# Patient Record
Sex: Female | Born: 1995 | Hispanic: No | Marital: Single | Smoking: Never smoker
Health system: Southern US, Community
[De-identification: ages and names within clinical notes are randomized; demographics above are authoritative.]

## PROBLEM LIST (undated history)

## (undated) ENCOUNTER — Inpatient Hospital Stay (HOSPITAL_COMMUNITY): Payer: Self-pay

## (undated) DIAGNOSIS — C9590 Leukemia, unspecified not having achieved remission: Secondary | ICD-10-CM

## (undated) DIAGNOSIS — F419 Anxiety disorder, unspecified: Secondary | ICD-10-CM

## (undated) DIAGNOSIS — J45909 Unspecified asthma, uncomplicated: Secondary | ICD-10-CM

## (undated) DIAGNOSIS — F32A Depression, unspecified: Secondary | ICD-10-CM

## (undated) HISTORY — PX: WISDOM TOOTH EXTRACTION: SHX21

## (undated) HISTORY — PX: PORTACATH PLACEMENT: SHX2246

---
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
## 2017-02-02 HISTORY — PX: RHINOPLASTY: SUR1284

## 2019-01-05 ENCOUNTER — Ambulatory Visit (HOSPITAL_COMMUNITY)
Admission: EM | Admit: 2019-01-05 | Discharge: 2019-01-05 | Disposition: A | Discharge status: Home / Self Care | Payer: BC Managed Care – PPO | Attending: Family Medicine | Admitting: Family Medicine

## 2019-01-05 ENCOUNTER — Other Ambulatory Visit: Payer: Self-pay

## 2019-01-05 ENCOUNTER — Encounter (HOSPITAL_COMMUNITY): Payer: Self-pay

## 2019-01-05 DIAGNOSIS — Z20828 Contact with and (suspected) exposure to other viral communicable diseases: Secondary | ICD-10-CM | POA: Diagnosis not present

## 2019-01-05 DIAGNOSIS — J029 Acute pharyngitis, unspecified: Secondary | ICD-10-CM | POA: Insufficient documentation

## 2019-01-05 DIAGNOSIS — J069 Acute upper respiratory infection, unspecified: Secondary | ICD-10-CM | POA: Insufficient documentation

## 2019-01-05 DIAGNOSIS — Z8249 Family history of ischemic heart disease and other diseases of the circulatory system: Secondary | ICD-10-CM | POA: Diagnosis not present

## 2019-01-05 LAB — POCT RAPID STREP A: Streptococcus, Group A Screen (Direct): NEGATIVE

## 2019-01-05 LAB — POC SARS CORONAVIRUS 2 AG: SARS Coronavirus 2 Ag: NEGATIVE

## 2019-01-05 LAB — POC SARS CORONAVIRUS 2 AG -  ED: SARS Coronavirus 2 Ag: NEGATIVE

## 2019-01-05 NOTE — ED Triage Notes (Signed)
Pt. States Sat. Morning she woke up with a nasal congestion, sore throat, dry cough, body aches/chills, headaches, she left work yesterday early. Wants COVID testing.

## 2019-01-05 NOTE — Discharge Instructions (Signed)
If your Covid-19 test is positive, you will receive a phone call from [facility] regarding your results. Negative test results are not called. Both positive and negative results area always visible on MyChart. If you do not have a MyChart account, sign up instructions are in your discharge papers.  You may use over the counter ibuprofen or acetaminophen as needed for your sore throat.  Over the counter products such as Colgate Peroxyl Mouth Sore Rinse or Chloraseptic Sore Throat Spray may provide some temporary relief. Your rapid strep test was negative today. We have sent your throat swab for culture and will let you know of any positive results.

## 2019-01-06 LAB — NOVEL CORONAVIRUS, NAA (HOSP ORDER, SEND-OUT TO REF LAB; TAT 18-24 HRS): SARS-CoV-2, NAA: NOT DETECTED

## 2019-01-07 NOTE — ED Provider Notes (Signed)
[facility]   HR:[id number] 01/05/19 Arrival Time: [id number]  ASSESSMENT & PLAN:  1. Sore throat   2. Viral URI     POC COVID negative. Reflex COVID testing sent. To self-quarantine until results are available. Rapid strep negative. Throat culture sent. Discussed viral syndrome in general. OTC symptom care.    Discharge Instructions     If your Covid-19 test is positive, you will receive a phone call from [facility] regarding your results. Negative test results are not called. Both positive and negative results area always visible on MyChart. If you do not have a MyChart account, sign up instructions are in your discharge papers.   You may use over the counter ibuprofen or acetaminophen as needed for your sore throat.   Over the counter products such as Colgate Peroxyl Mouth Sore Rinse or Chloraseptic Sore Throat Spray may provide some temporary relief.  Your rapid strep test was negative today. We have sent your throat swab for culture and will let you know of any positive results.          Follow-up Information    [facility].   Specialty: Urgent Care Why: As needed. Contact information: [place] [phone]          Reviewed expectations re: course of current medical issues. Questions answered. Outlined signs and symptoms indicating need for more acute intervention. Patient verbalized understanding. After Visit Summary given.   SUBJECTIVE: History from: patient. Jaime Payne is a 23 y.o. female who requests COVID-19 testing. Known COVID-19 contact: questions co-worker. Recent travel: none. Denies: fever, difficulty breathing and headache. Does report mild ST, nasal congestion. "Felt chilled last night."  ROS: As per HPI.   OBJECTIVE:  Vitals:   01/05/19 1213  BP: (!) 119/57  Pulse: 78  Resp: 17  Temp: 99.6 F (37.6 C)  TempSrc: Oral  SpO2: 99%    General  appearance: alert; no distress Eyes: PERRLA; EOMI; conjunctiva normal HENT: [place]; AT; nasal mucosa normal; oral mucosa normal Neck: supple  ungs: clear to auscultation bilaterally; unlabored Heart: regular rate and rhythm Abdomen: soft, non-tender Extremities: no edema Skin: warm and dry Neurologic: normal gait Psychological: alert and cooperative; normal mood and affect  Labs:  Labs Reviewed  CULTURE, GROUP A STREP ([facility])  NOVEL CORONAVIRUS, NAA (HOSP ORDER, SEND-OUT TO REF LAB; TAT 18-24 HRS)  POC SARS CORONAVIRUS 2 AG -  ED  POC SARS CORONAVIRUS 2 AG  POCT RAPID STREP A     Social History   Socioeconomic History  . Marital status: Single    Spouse name: Not on file  . Number of children: Not on file  . Years of education: Not on file  . Highest education level: Not on file  Occupational History  . Not on file  Social Needs  . Financial resource strain: Not on file  . Food insecurity    Worry: Not on file    Inability: Not on file  . Transportation needs    Medical: Not on file    Non-medical: Not on file  Tobacco Use  . Smoking status: Never Smoker  . Smokeless tobacco: Never Used  Substance and Sexual Activity  . Alcohol use: Never    Frequency: Never  . Drug use: Never  . Sexual activity: Not on file  Lifestyle  . Physical activity    Days per week: Not on file    Minutes per session: Not  on file  . Stress: Not on file  Relationships  . Social Herbalist on phone: Not on file    Gets together: Not on file    Attends religious service: Not on file    Active member of club or organization: Not on file    Attends meetings of clubs or organizations: Not on file    Relationship status: Not on file  . Intimate partner violence    Fear of current or ex partner: Not on file    Emotionally abused: Not on file    Physically abused: Not on file    Forced sexual activity: Not on file  Other Topics Concern  . Not on file  Social History Narrative   . Not on file   Family History  Problem Relation Age of Onset  . Anxiety disorder Mother   . Diabetes Mother   . Hypertension Mother   . Diabetes Father   . Hypertension Father    History reviewed. No pertinent surgical history.   Vanessa Kick, MD 01/07/19 1004

## 2019-01-08 LAB — CULTURE, GROUP A STREP (THRC)

## 2019-01-19 ENCOUNTER — Encounter: Payer: Self-pay | Admitting: Obstetrics

## 2019-01-19 ENCOUNTER — Other Ambulatory Visit: Payer: Self-pay

## 2019-01-19 ENCOUNTER — Ambulatory Visit (INDEPENDENT_AMBULATORY_CARE_PROVIDER_SITE_OTHER): Payer: BC Managed Care – PPO

## 2019-01-19 VITALS — BP 110/67 | HR 89 | Temp 98.3°F | Wt 160.0 lb

## 2019-01-19 DIAGNOSIS — Z3201 Encounter for pregnancy test, result positive: Secondary | ICD-10-CM | POA: Diagnosis not present

## 2019-01-19 LAB — POCT URINE PREGNANCY: Preg Test, Ur: POSITIVE — AB

## 2019-01-19 NOTE — Progress Notes (Addendum)
Ms. Mcdowall presents today for UPT. She has no unusual complaints.  LMP:12/01/2018    OBJECTIVE: Appears well, in no apparent distress.  OB History   No obstetric history on file.    Home UPT Result:POSITIVE X2 In-Office UPT result: POSITIVE  Patient seen and assessed by nursing staff during this encounter. I have reviewed the chart and agree with the documentation and plan.  Baltazar Najjar, MD 02/06/2019 8:40 AM    I have reviewed the patient's medical, obstetrical, social, and family histories, and medications.   ASSESSMENT: Positive pregnancy test  PLAN Prenatal care to be completed at: Patient is unsure at this time, she is weighting her options.  Patient seen and assessed by nursing staff during this encounter. I have reviewed the chart and agree with the documentation and plan.  Baltazar Najjar, MD 01/19/2019

## 2019-11-29 LAB — OB RESULTS CONSOLE RPR: RPR: NONREACTIVE

## 2019-11-29 LAB — OB RESULTS CONSOLE RUBELLA ANTIBODY, IGM: Rubella: NON-IMMUNE/NOT IMMUNE

## 2019-11-29 LAB — OB RESULTS CONSOLE GC/CHLAMYDIA
Chlamydia: POSITIVE
Gonorrhea: NEGATIVE

## 2019-11-29 LAB — OB RESULTS CONSOLE HEPATITIS B SURFACE ANTIGEN: Hepatitis B Surface Ag: NEGATIVE

## 2019-11-29 LAB — OB RESULTS CONSOLE GBS: GBS: POSITIVE

## 2019-11-29 LAB — HEPATITIS C ANTIBODY: HCV Ab: NEGATIVE

## 2019-11-29 LAB — OB RESULTS CONSOLE HIV ANTIBODY (ROUTINE TESTING): HIV: NONREACTIVE

## 2020-02-03 NOTE — L&D Delivery Note (Signed)
Delivery Note Jaime Payne is a G2P0010 at 20w0dwho had a spontaneous delivery at 2101 a viable female was delivered via  LOA.  APGAR: 8,9 ; weight 9lb1oz (4111g)  .    Admitted for painful contractions and elevated blood pressures. Met criteria for gestational hypertension throughout intrapartum course. Labor augmented with pitocin. Progressed normally. Received epidural for pain management. Pushed for 33 minutes. Baby was delivered without difficulty. No nuchal cord. Delivery of placenta was spontaneous.   Cervix examined and intact. 2nd degree perineal laceration was repaired in the normal sterile fashion with 2-0 and 3-0 vicryl. A bleeding right vaginal laceration was repaired with 3-0 vicryl. DRE with good rectal tone and no sutures.   Bimanual exam significant for lower uterine segment clots, which were expressed. Misoprostol 8061m administered per rectum due to lower uterine segment atony with good response.  Estimated blood loss 200cc. Instrument and gauze counts were correct at the end of the procedure.   Placenta status: to L&D Anesthesia:  epidural Episiotomy:  none Lacerations:  2nd degree perineal and superificial right vaginal Suture Repair: 2.0 3.0 vicryl Est. Blood Loss (mL):  2001mMom to postpartum.  Baby to Couplet care / Skin to Skin.  Jaime Lathe27/2022, 9:37 PM

## 2020-04-09 DIAGNOSIS — O99810 Abnormal glucose complicating pregnancy: Secondary | ICD-10-CM | POA: Diagnosis not present

## 2020-04-17 ENCOUNTER — Other Ambulatory Visit: Payer: Self-pay | Admitting: Internal Medicine

## 2020-04-17 DIAGNOSIS — R0600 Dyspnea, unspecified: Secondary | ICD-10-CM | POA: Diagnosis not present

## 2020-04-18 ENCOUNTER — Other Ambulatory Visit (HOSPITAL_COMMUNITY): Payer: Self-pay | Admitting: Family Medicine

## 2020-04-18 ENCOUNTER — Other Ambulatory Visit: Payer: Self-pay | Admitting: Family Medicine

## 2020-04-18 DIAGNOSIS — R0600 Dyspnea, unspecified: Secondary | ICD-10-CM

## 2020-04-18 DIAGNOSIS — R0602 Shortness of breath: Secondary | ICD-10-CM

## 2020-04-18 DIAGNOSIS — R059 Cough, unspecified: Secondary | ICD-10-CM

## 2020-04-25 ENCOUNTER — Emergency Department (HOSPITAL_COMMUNITY)
Admission: EM | Admit: 2020-04-25 | Discharge: 2020-04-25 | Disposition: A | Discharge status: Home / Self Care | Payer: Medicaid Other | Attending: Emergency Medicine | Admitting: Emergency Medicine

## 2020-04-25 ENCOUNTER — Emergency Department (HOSPITAL_COMMUNITY): Payer: Medicaid Other

## 2020-04-25 ENCOUNTER — Ambulatory Visit (HOSPITAL_COMMUNITY): Payer: Medicaid Other

## 2020-04-25 ENCOUNTER — Encounter (HOSPITAL_COMMUNITY): Payer: Self-pay

## 2020-04-25 ENCOUNTER — Encounter (HOSPITAL_COMMUNITY): Payer: Self-pay | Admitting: Emergency Medicine

## 2020-04-25 DIAGNOSIS — D72829 Elevated white blood cell count, unspecified: Secondary | ICD-10-CM | POA: Diagnosis not present

## 2020-04-25 DIAGNOSIS — O99511 Diseases of the respiratory system complicating pregnancy, first trimester: Secondary | ICD-10-CM | POA: Diagnosis present

## 2020-04-25 DIAGNOSIS — B9689 Other specified bacterial agents as the cause of diseases classified elsewhere: Secondary | ICD-10-CM | POA: Diagnosis not present

## 2020-04-25 DIAGNOSIS — R000 Tachycardia, unspecified: Secondary | ICD-10-CM | POA: Diagnosis not present

## 2020-04-25 DIAGNOSIS — Z8616 Personal history of COVID-19: Secondary | ICD-10-CM | POA: Diagnosis not present

## 2020-04-25 DIAGNOSIS — R0602 Shortness of breath: Secondary | ICD-10-CM | POA: Diagnosis not present

## 2020-04-25 DIAGNOSIS — Z3A01 Less than 8 weeks gestation of pregnancy: Secondary | ICD-10-CM | POA: Diagnosis not present

## 2020-04-25 DIAGNOSIS — K449 Diaphragmatic hernia without obstruction or gangrene: Secondary | ICD-10-CM | POA: Diagnosis not present

## 2020-04-25 DIAGNOSIS — R059 Cough, unspecified: Secondary | ICD-10-CM | POA: Diagnosis not present

## 2020-04-25 DIAGNOSIS — R0600 Dyspnea, unspecified: Secondary | ICD-10-CM

## 2020-04-25 LAB — URINALYSIS, ROUTINE W REFLEX MICROSCOPIC
Bilirubin Urine: NEGATIVE
Glucose, UA: NEGATIVE mg/dL
Hgb urine dipstick: NEGATIVE
Ketones, ur: NEGATIVE mg/dL
Nitrite: NEGATIVE
Protein, ur: NEGATIVE mg/dL
Specific Gravity, Urine: 1.009 (ref 1.005–1.030)
Squamous Epithelial / LPF: >50 — ABNORMAL HIGH (ref 0–5)
pH: 6 (ref 5.0–8.0)

## 2020-04-25 LAB — CBC WITH DIFFERENTIAL/PLATELET
Abs Immature Granulocytes: 0.12 10*3/uL — ABNORMAL HIGH (ref 0.00–0.07)
Basophils Absolute: 0.1 10*3/uL (ref 0.0–0.1)
Basophils Relative: 1 %
Eosinophils Absolute: 0.1 10*3/uL (ref 0.0–0.5)
Eosinophils Relative: 1 %
HCT: 28.4 % — ABNORMAL LOW (ref 36.0–46.0)
Hemoglobin: 9.4 g/dL — ABNORMAL LOW (ref 12.0–15.0)
Immature Granulocytes: 1 %
Lymphocytes Relative: 48 %
Lymphs Abs: 4 10*3/uL (ref 0.7–4.0)
MCH: 30.8 pg (ref 26.0–34.0)
MCHC: 33.1 g/dL (ref 30.0–36.0)
MCV: 93.1 fL (ref 80.0–100.0)
Monocytes Absolute: 0.4 10*3/uL (ref 0.1–1.0)
Monocytes Relative: 5 %
Neutro Abs: 3.7 10*3/uL (ref 1.7–7.7)
Neutrophils Relative %: 44 %
Platelets: 230 10*3/uL (ref 150–400)
RBC: 3.05 MIL/uL — ABNORMAL LOW (ref 3.87–5.11)
RDW: 15.4 % (ref 11.5–15.5)
WBC: 8.5 10*3/uL (ref 4.0–10.5)
nRBC: 0 % (ref 0.0–0.2)

## 2020-04-25 LAB — COMPREHENSIVE METABOLIC PANEL
ALT: 33 U/L (ref 0–44)
AST: 32 U/L (ref 15–41)
Albumin: 2.3 g/dL — ABNORMAL LOW (ref 3.5–5.0)
Alkaline Phosphatase: 139 U/L — ABNORMAL HIGH (ref 38–126)
Anion gap: 7 (ref 5–15)
BUN: <5 mg/dL — ABNORMAL LOW (ref 6–20)
CO2: 21 mmol/L — ABNORMAL LOW (ref 22–32)
Calcium: 8.7 mg/dL — ABNORMAL LOW (ref 8.9–10.3)
Chloride: 108 mmol/L (ref 98–111)
Creatinine, Ser: 0.45 mg/dL (ref 0.44–1.00)
GFR, Estimated: >60 mL/min (ref >60)
Glucose, Bld: 94 mg/dL (ref 70–99)
Potassium: 4 mmol/L (ref 3.5–5.1)
Sodium: 136 mmol/L (ref 135–145)
Total Bilirubin: 0.5 mg/dL (ref 0.3–1.2)
Total Protein: 5.9 g/dL — ABNORMAL LOW (ref 6.5–8.1)

## 2020-04-25 MED ORDER — IOHEXOL 350 MG/ML SOLN
100.0000 mL | Freq: Once | INTRAVENOUS | Status: AC | PRN
  Administered 2020-04-25: 52 mL via INTRAVENOUS

## 2020-04-25 NOTE — ED Triage Notes (Signed)
Pt sent over by MD to r/o PE , pt has been sob for a Few weeks now , got an inhaler that helped some , pt is 7.5 months pregnant

## 2020-04-25 NOTE — ED Notes (Signed)
Patient transported to CT 

## 2020-04-25 NOTE — ED Provider Notes (Signed)
[facility] EMERGENCY DEPARTMENT Provider Note   CSN: [id number] Arrival date & time: 04/25/20  1053     History No chief complaint on file.   Jaime Payne is a 25 y.o. female.  HPI Patient is a 25 year old female who presents the emergency department due to worsening shortness of breath as well as dry cough.  Patient states she is G1 and about 7.5 months pregnant.  She states that she got COVID-19 in January of this year and since being diagnosed with COVID-19 she has had a persistent cough.  She states it occurs intermittently throughout the day and is dry.  She will experience chest tightness when coughing but otherwise denies any continued chest pain.  Also reports some mild shortness of breath that began worsening over the past few weeks.  Also reports intermittent swelling in the lower extremities that worsens when standing for long periods of time and alleviates when elevating her legs.  She was evaluated by her primary doctor and they requested that she come to the emergency department for rule out of PE.  No abdominal pain, nausea, vomiting, urinary complaints.    History reviewed. No pertinent past medical history.  There are no problems to display for this patient.   History reviewed. No pertinent surgical history.   OB History    Gravida  1   Para      Term      Preterm      AB      Living        SAB      IAB      Ectopic      Multiple      Live Births              Family History  Problem Relation Age of Onset  . Anxiety disorder Mother   . Diabetes Mother   . Hypertension Mother   . Diabetes Father   . Hypertension Father     Social History   Tobacco Use  . Smoking status: Never Smoker  . Smokeless tobacco: Never Used  Substance Use Topics  . Alcohol use: Never  . Drug use: Never    Home Medications Prior to Admission medications   Not on File    Allergies    Patient has no known allergies.  Review of  Systems   Review of Systems  All other systems reviewed and are negative. Ten systems reviewed and are negative for acute change, except as noted in the HPI.    Physical Exam Updated Vital Signs BP 110/60   Pulse 97   Temp 98.6 F (37 C)   Resp (!) 26   SpO2 96%   Physical Exam Vitals and nursing note reviewed.  Constitutional:      General: She is not in acute distress.    Appearance: Normal appearance. She is not ill-appearing, toxic-appearing or diaphoretic.  HENT:     Head: Normocephalic and atraumatic.     Right Ear: External ear normal.     Left Ear: External ear normal.     Nose: Nose normal.     Mouth/Throat:     Mouth: Mucous membranes are moist.     Pharynx: Oropharynx is clear. No oropharyngeal exudate or posterior oropharyngeal erythema.  Eyes:     Extraocular Movements: Extraocular movements intact.  Cardiovascular:     Rate and Rhythm: Regular rhythm. Tachycardia present.     Pulses: Normal pulses.     Heart  sounds: Normal heart sounds. No murmur heard. No friction rub. No gallop.   Pulmonary:     Effort: Pulmonary effort is normal. No respiratory distress.     Breath sounds: Normal breath sounds. No stridor. No wheezing, rhonchi or rales.     Comments: Lungs are clear to auscultation bilaterally. Abdominal:     General: Abdomen is flat.     Tenderness: There is no abdominal tenderness.  Musculoskeletal:        General: Normal range of motion.     Cervical back: Normal range of motion and neck supple. No tenderness.     Right lower leg: Edema present.     Left lower leg: Edema present.     Comments: 1+ symmetrical pitting edema noted in the bilateral lower extremities.  No calf pain.  Skin:    General: Skin is warm and dry.  Neurological:     General: No focal deficit present.     Mental Status: She is alert and oriented to person, place, and time.  Psychiatric:        Mood and Affect: Mood normal.        Behavior: Behavior normal.    ED Results  / Procedures / Treatments   Labs (all labs ordered are listed, but only abnormal results are displayed) Labs Reviewed  COMPREHENSIVE METABOLIC PANEL - Abnormal; Notable for the following components:      Result Value   CO2 21 (*)    BUN <5 (*)    Calcium 8.7 (*)    Total Protein 5.9 (*)    Albumin 2.3 (*)    Alkaline Phosphatase 139 (*)    All other components within normal limits  CBC WITH DIFFERENTIAL/PLATELET - Abnormal; Notable for the following components:   RBC 3.05 (*)    Hemoglobin 9.4 (*)    HCT 28.4 (*)    Abs Immature Granulocytes 0.12 (*)    All other components within normal limits  URINALYSIS, ROUTINE W REFLEX MICROSCOPIC - Abnormal; Notable for the following components:   APPearance CLOUDY (*)    Leukocytes,Ua LARGE (*)    Bacteria, UA FEW (*)    Squamous Epithelial / LPF >50 (*)    All other components within normal limits    EKG EKG Interpretation  Date/Time:  Thursday April 25 2020 11:14:30 EDT Ventricular Rate:  93 PR Interval:    QRS Duration: 95 QT Interval:  342 QTC Calculation: 426 R Axis:   79 Text Interpretation: Sinus rhythm ST elev, probable normal early repol pattern Confirmed by Lennice Sites [phone]) on 04/25/2020 11:25:56 AM   Radiology CT Angio Chest PE W and/or Wo Contrast  Result Date: 04/25/2020 CLINICAL DATA:  Cough and shortness of breath EXAM: CT ANGIOGRAPHY CHEST WITH CONTRAST TECHNIQUE: Multidetector CT imaging of the chest was performed using the standard protocol during bolus administration of intravenous contrast. Multiplanar CT image reconstructions and MIPs were obtained to evaluate the vascular anatomy. CONTRAST:  52 mL OMNIPAQUE IOHEXOL 350 MG/ML SOLN COMPARISON:  None. FINDINGS: Cardiovascular: There is no demonstrable pulmonary embolus. There is no thoracic aortic aneurysm or dissection. The visualized great vessels normal. Note that the right innominate and left common carotid arteries arise as a common trunk, an anatomic  variant. No pericardial effusion or pericardial thickening. Mediastinum/Nodes: Visualized thyroid appears normal. Thymic tissue is present, normal for age. There is no appreciable thoracic adenopathy. There is a small hiatal hernia. Lungs/Pleura: Clear. No appreciable pleural effusion. No pneumothorax. Trachea and major bronchial structures  appear patent. Upper Abdomen: Visualized upper abdominal structures appear unremarkable. Musculoskeletal: There are no blastic or lytic bone lesions. No evident chest wall lesions. Review of the MIP images confirms the above findings. IMPRESSION: 1. No demonstrable pulmonary embolus. No thoracic aortic aneurysm or dissection. 2.  Lungs clear. 3.  No evident adenopathy. Electronically Signed   By: Lowella Grip III M.D.   On: 04/25/2020 13:48   Procedures Procedures   Medications Ordered in ED Medications  iohexol (OMNIPAQUE) 350 MG/ML injection 100 mL (52 mLs Intravenous Contrast Given 04/25/20 1323)    ED Course  I have reviewed the triage vital signs and the nursing notes.  Pertinent labs & imaging results that were available during my care of the patient were reviewed by me and considered in my medical decision making (see chart for details).    MDM Rules/Calculators/A&P                          Pt is a 25 y.o. female who presents the emergency department due to cough and shortness of breath.  She was sent to the ED by her regular doctor for pulmonary embolism rule out.  Labs: CBC with an RBC of 3.05, hemoglobin of 9.4, hematocrit of 28.4. CMP with a CO2 of 21, BUN less than 5, calcium of 8.7, total protein of 5.9, albumin of 2.3, alk phos of 139. UA showing greater than 50 squamous epithelial cells.  Large leukocytes as well as few bacteria.  Imaging: CT PE study showing no PE, lungs are clear, no thoracic aortic aneurysm or dissection, no evident adenopathy.  I, Rayna Sexton, PA-C, personally reviewed and evaluated these images and lab  results as part of my medical decision-making.  Lab work and imaging is reassuring.  No signs of PE.  Oxygen saturations above 98% since arrival.  This was discussed with the patient and she was quite relieved.  Recommended that she follow-up with her regular doctor for further evaluation regarding her cough.  We discussed return precautions.  Feel she is stable for discharge and she is agreeable.  Her questions were answered and she was amicable to time of discharge.  Note: Portions of this report may have been transcribed using voice recognition software. Every effort was made to ensure accuracy; however, inadvertent computerized transcription errors may be present.   Final Clinical Impression(s) / ED Diagnoses Final diagnoses:  Cough  Dyspnea, unspecified type   Rx / DC Orders ED Discharge Orders    None       Rayna Sexton, PA-C 04/25/20 1501    Lennice Sites, DO 04/25/20 1503

## 2020-04-25 NOTE — Discharge Instructions (Signed)
Like we discussed, if you develop any new or worsening symptoms please return to the emergency department immediately for reevaluation.  Otherwise, please follow-up with your regular doctor regarding your symptoms.  It was a pleasure to meet you.

## 2020-04-26 DIAGNOSIS — Z23 Encounter for immunization: Secondary | ICD-10-CM | POA: Diagnosis not present

## 2020-05-07 DIAGNOSIS — Z3A32 32 weeks gestation of pregnancy: Secondary | ICD-10-CM | POA: Diagnosis not present

## 2020-05-07 DIAGNOSIS — O99213 Obesity complicating pregnancy, third trimester: Secondary | ICD-10-CM | POA: Diagnosis not present

## 2020-05-20 ENCOUNTER — Other Ambulatory Visit: Payer: Self-pay

## 2020-05-20 ENCOUNTER — Encounter (HOSPITAL_COMMUNITY): Payer: Self-pay | Admitting: Obstetrics & Gynecology

## 2020-05-20 ENCOUNTER — Inpatient Hospital Stay (HOSPITAL_COMMUNITY)
Admission: AD | Admit: 2020-05-20 | Discharge: 2020-05-20 | Disposition: A | Discharge status: Home / Self Care | Payer: Medicaid Other | Attending: Obstetrics & Gynecology | Admitting: Obstetrics & Gynecology

## 2020-05-20 DIAGNOSIS — Z3A34 34 weeks gestation of pregnancy: Secondary | ICD-10-CM | POA: Diagnosis not present

## 2020-05-20 DIAGNOSIS — O26893 Other specified pregnancy related conditions, third trimester: Secondary | ICD-10-CM | POA: Diagnosis not present

## 2020-05-20 DIAGNOSIS — R102 Pelvic and perineal pain: Secondary | ICD-10-CM

## 2020-05-20 DIAGNOSIS — Z7951 Long term (current) use of inhaled steroids: Secondary | ICD-10-CM | POA: Diagnosis not present

## 2020-05-20 DIAGNOSIS — O2343 Unspecified infection of urinary tract in pregnancy, third trimester: Secondary | ICD-10-CM

## 2020-05-20 HISTORY — DX: Anxiety disorder, unspecified: F41.9

## 2020-05-20 HISTORY — DX: Unspecified asthma, uncomplicated: J45.909

## 2020-05-20 HISTORY — DX: Depression, unspecified: F32.A

## 2020-05-20 HISTORY — DX: Leukemia, unspecified not having achieved remission: C95.90

## 2020-05-20 LAB — URINALYSIS, ROUTINE W REFLEX MICROSCOPIC
Bilirubin Urine: NEGATIVE
Glucose, UA: NEGATIVE mg/dL
Hgb urine dipstick: NEGATIVE
Ketones, ur: 5 mg/dL — AB
Nitrite: NEGATIVE
Protein, ur: 30 mg/dL — AB
Specific Gravity, Urine: 1.021 (ref 1.005–1.030)
pH: 5 (ref 5.0–8.0)

## 2020-05-20 LAB — WET PREP, GENITAL
Clue Cells Wet Prep HPF POC: NONE SEEN
Sperm: NONE SEEN
Trich, Wet Prep: NONE SEEN
Yeast Wet Prep HPF POC: NONE SEEN

## 2020-05-20 MED ORDER — CEFADROXIL 500 MG PO CAPS: 500.0000 mg | ORAL_CAPSULE | Freq: Two times a day (BID) | ORAL | 0 refills | Status: DC

## 2020-05-20 MED ORDER — CYCLOBENZAPRINE HCL 5 MG PO TABS
10.0000 mg | ORAL_TABLET | Freq: Once | ORAL | Status: AC
  Administered 2020-05-20: 10 mg via ORAL
  Filled 2020-05-20: qty 2

## 2020-05-20 MED ORDER — ACETAMINOPHEN 325 MG PO TABS: 650.0000 mg | ORAL_TABLET | ORAL | 0 refills | Status: AC | PRN

## 2020-05-20 MED ORDER — CYCLOBENZAPRINE HCL 10 MG PO TABS: 10.0000 mg | ORAL_TABLET | Freq: Two times a day (BID) | ORAL | 0 refills | Status: DC | PRN

## 2020-05-20 MED ORDER — ACETAMINOPHEN 500 MG PO TABS
1000.0000 mg | ORAL_TABLET | Freq: Once | ORAL | Status: AC
  Administered 2020-05-20: 1000 mg via ORAL
  Filled 2020-05-20: qty 2

## 2020-05-20 NOTE — MAU Note (Signed)
Having unbearable pelvic pain.  Started on Friday morning, but went away.  Again on Sat and Sun.  Today is much worse and not going away, can't even walk right.   No bleeding or ROM.

## 2020-05-20 NOTE — MAU Provider Note (Signed)
History     CSN: [id number]  Arrival date and time: 05/20/20 1448   Event Date/Time   First Provider Initiated Contact with Patient 05/20/20 1533      Chief Complaint  Patient presents with  . Pelvic Pain   HPI Jaime Payne is a 25 y.o. G2P0010 at [redacted]w[redacted]d who presents to MAU with chief complaint of pelvic pain. This is a new problem, onset Friday morning. Patient endorses working her normal shift Thursday. She states she woke up with 10/10 pain in the middle of her vagina. She has experienced the same pain with movement since that time. She states she was sedentary all weekend. Pain completely resolves at rest but returns as soon as she ambulates. She has not taken medication as she prefers to avoid all medications unless absolutely necessary.  Patient works as a Scientist, physiological. She was given a chair to use during her shifts but continues to perform a significant amount of short-distance walking, bending, and twisting. She states she only walks at work and typically sits or lies down when not working.  Patient receives prenatal care with [facility]. Her next appointment was scheduled for 04/29 but she had to cancel it to accommodate her baby shower and states she was not given an option for rescheduling.  OB History    Gravida  2   Para      Term      Preterm      AB  1   Living        SAB      IAB  1   Ectopic      Multiple      Live Births              Past Medical History:  Diagnosis Date  . Anxiety   . Asthma   . Depression   . 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 PORTACATH PLACEMENT    . RHINOPLASTY  2019    Family History  Problem Relation Age of Onset  . Anxiety disorder Mother   . Diabetes Mother   . Hypertension Mother   . Diabetes Father   . Hypertension Father     Social History   Tobacco Use  . Smoking status: Never Smoker  . Smokeless tobacco: Never Used  Vaping Use  . Vaping Use:  Never used  Substance Use Topics  . Alcohol use: Never  . Drug use: Never    Allergies: No Known Allergies  Medications Prior to Admission  Medication Sig Dispense Refill Last Dose  . budesonide (PULMICORT) 180 MCG/ACT inhaler Inhale into the lungs 2 (two) times daily.   05/19/2020 at Unknown time  . ferrous sulfate 325 (65 FE) MG tablet Take 325 mg by mouth daily with breakfast.   05/20/2020 at Unknown time  . Prenatal Vit-Fe Fumarate-FA (PRENATAL MULTIVITAMIN) TABS tablet Take 1 tablet by mouth daily at 12 noon.   05/20/2020 at Unknown time    Review of Systems  Genitourinary: Positive for pelvic pain.  All other systems reviewed and are negative.  Physical Exam   Blood pressure 117/73, pulse (!) 107, temperature 97.9 F (36.6 C), temperature source Oral, resp. rate 20, height 5\' 5"  (1.651 m), weight 93.1 kg, SpO2 99 %.  Physical Exam Vitals and nursing note reviewed. Exam conducted with a chaperone present.  Constitutional:      Appearance: Normal appearance. She is not ill-appearing.  Cardiovascular:  Rate and Rhythm: Normal rate.     Pulses: Normal pulses.     Heart sounds: Normal heart sounds.  Pulmonary:     Effort: Pulmonary effort is normal.     Breath sounds: Normal breath sounds.  Abdominal:     Comments: Gravid  Skin:    Capillary Refill: Capillary refill takes less than 2 seconds.  Neurological:     Mental Status: She is alert and oriented to person, place, and time.  Psychiatric:        Mood and Affect: Mood normal.        Behavior: Behavior normal.        Thought Content: Thought content normal.        Judgment: Judgment normal.     MAU Course  Procedures  --Cervix visually closed on speculum exam.  --Reactive tracing: baseline 130, mod var, + accels, no decels --Toco: UI --Discussed suspicion for syphysis pubis as origin of pain. Reviewed interventions including PO medication.   Orders Placed This Encounter  Procedures  . Wet prep, genital   . Culture, OB Urine  . Urinalysis, Routine w reflex microscopic Urine, Clean Catch  . Discharge patient   Patient Vitals for the past 24 hrs:  BP Temp Temp src Pulse Resp SpO2 Height Weight  05/20/20 1643 128/72 -- -- 94 14 -- -- --  05/20/20 1640 -- -- -- -- -- 99 % -- --  05/20/20 1524 117/73 -- -- (!) 107 -- -- -- --  05/20/20 1506 118/68 97.9 F (36.6 C) Oral 92 20 99 % 5\' 5"  (1.651 m) 93.1 kg   Results for orders placed or performed during the hospital encounter of 05/20/20 (from the past 24 hour(s))  Urinalysis, Routine w reflex microscopic Urine, Clean Catch     Status: Abnormal   Collection Time: 05/20/20  3:27 PM  Result Value Ref Range   Color, Urine AMBER (A) YELLOW   APPearance CLOUDY (A) CLEAR   Specific Gravity, Urine 1.021 1.005 - 1.030   pH 5.0 5.0 - 8.0   Glucose, UA NEGATIVE NEGATIVE mg/dL   Hgb urine dipstick NEGATIVE NEGATIVE   Bilirubin Urine NEGATIVE NEGATIVE   Ketones, ur 5 (A) NEGATIVE mg/dL   Protein, ur 30 (A) NEGATIVE mg/dL   Nitrite NEGATIVE NEGATIVE   Leukocytes,Ua MODERATE (A) NEGATIVE   RBC / HPF 0-5 0 - 5 RBC/hpf   WBC, UA 11-20 0 - 5 WBC/hpf   Bacteria, UA MANY (A) NONE SEEN   Squamous Epithelial / LPF 21-50 0 - 5   Mucus PRESENT   Wet prep, genital     Status: Abnormal   Collection Time: 05/20/20  3:48 PM   Specimen: Vaginal  Result Value Ref Range   Yeast Wet Prep HPF POC NONE SEEN NONE SEEN   Trich, Wet Prep NONE SEEN NONE SEEN   Clue Cells Wet Prep HPF POC NONE SEEN NONE SEEN   WBC, Wet Prep HPF POC MANY (A) NONE SEEN   Sperm NONE SEEN    Meds ordered this encounter  Medications  . acetaminophen (TYLENOL) tablet 1,000 mg  . cyclobenzaprine (FLEXERIL) tablet 10 mg  . cefadroxil (DURICEF) 500 MG capsule    Sig: Take 1 capsule (500 mg total) by mouth 2 (two) times daily.    Dispense:  14 capsule    Refill:  0    Order Specific Question:   Supervising Provider    Answer:   Truett Mainland [[id number]]  . cyclobenzaprine (FLEXERIL) 10  MG tablet    Sig: Take 1 tablet (10 mg total) by mouth 2 (two) times daily as needed for muscle spasms.    Dispense:  20 tablet    Refill:  0    Order Specific Question:   Supervising Provider    Answer:   Truett Mainland [[id number]]  . acetaminophen (TYLENOL) 325 MG tablet    Sig: Take 2 tablets (650 mg total) by mouth every 4 (four) hours as needed.    Dispense:  180 tablet    Refill:  0    Order Specific Question:   Supervising Provider    Answer:   Truett Mainland [[id number]]   Assessment and Plan  --25 y.o. 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

## 2020-05-20 NOTE — Discharge Instructions (Signed)
   Follow these instructions at home:  Watch your condition for any changes.  When the pain starts, relax. Then try any of these methods to help with the pain: ? Sitting down. ? Flexing your knees up to your abdomen. ? Lying on your side with one pillow under your abdomen and another pillow between your legs. ? Sitting in a warm bath for 15-20 minutes or until the pain goes away.  Take over-the-counter and prescription medicines only as told by your health care provider.  Move slowly when you sit down or stand up.  Avoid long walks if they cause pain.  Stop or reduce your physical activities if they cause pain.  Keep all follow-up visits as told by your health care provider. This is important.   Contact a health care provider if:  Your pain does not go away with treatment.  You feel pain in your back that you did not have before.  Your medicine is not helping. Get help right away if:  You have a fever or chills.  You develop uterine contractions.  You have vaginal bleeding.  You have nausea or vomiting.  You have diarrhea.  You have pain when you urinate. Summary  This pain usually begins in the second trimester (13-28 weeks). It occurs because the uterus is stretching with the growing baby, and it is not harmful to the baby.  You may notice the pain when you suddenly change position, when you cough or sneeze, or during physical activity.  Relaxing, flexing your knees to your abdomen, lying on one side, or taking a warm bath may help to get rid of the pain.  Get help from your health care provider if the pain does not go away or if you have vaginal bleeding, nausea, vomiting, diarrhea, or painful urination. This information is not intended to replace advice given to you by your health care provider. Make sure you discuss any questions you have with your health care provider. Document Revised: 07/07/2017 Document Reviewed: 07/07/2017 Elsevier Patient Education  [place].

## 2020-05-21 LAB — GC/CHLAMYDIA PROBE AMP (~~LOC~~) NOT AT ARMC
Chlamydia: NEGATIVE
Comment: NEGATIVE
Comment: NORMAL
Neisseria Gonorrhea: NEGATIVE

## 2020-05-22 ENCOUNTER — Other Ambulatory Visit: Payer: Self-pay | Admitting: Advanced Practice Midwife

## 2020-05-22 LAB — CULTURE, OB URINE: Culture: 10000 — AB

## 2020-05-22 MED ORDER — AMOXICILLIN 500 MG PO CAPS: 500.0000 mg | ORAL_CAPSULE | Freq: Three times a day (TID) | ORAL | 2 refills | Status: DC

## 2020-05-29 DIAGNOSIS — Z3493 Encounter for supervision of normal pregnancy, unspecified, third trimester: Secondary | ICD-10-CM | POA: Diagnosis not present

## 2020-06-05 DIAGNOSIS — O99019 Anemia complicating pregnancy, unspecified trimester: Secondary | ICD-10-CM | POA: Diagnosis not present

## 2020-06-25 ENCOUNTER — Encounter (HOSPITAL_COMMUNITY): Payer: Self-pay | Admitting: Obstetrics and Gynecology

## 2020-06-25 ENCOUNTER — Inpatient Hospital Stay (HOSPITAL_COMMUNITY)
Admission: AD | Admit: 2020-06-25 | Discharge: 2020-06-25 | Disposition: A | Discharge status: Home / Self Care | Payer: Medicaid Other | Attending: Obstetrics and Gynecology | Admitting: Obstetrics and Gynecology

## 2020-06-25 ENCOUNTER — Other Ambulatory Visit: Payer: Self-pay

## 2020-06-25 DIAGNOSIS — O26893 Other specified pregnancy related conditions, third trimester: Secondary | ICD-10-CM

## 2020-06-25 DIAGNOSIS — Z3A39 39 weeks gestation of pregnancy: Secondary | ICD-10-CM | POA: Insufficient documentation

## 2020-06-25 DIAGNOSIS — Z7951 Long term (current) use of inhaled steroids: Secondary | ICD-10-CM | POA: Diagnosis not present

## 2020-06-25 DIAGNOSIS — O471 False labor at or after 37 completed weeks of gestation: Secondary | ICD-10-CM | POA: Diagnosis present

## 2020-06-25 DIAGNOSIS — N898 Other specified noninflammatory disorders of vagina: Secondary | ICD-10-CM | POA: Insufficient documentation

## 2020-06-25 DIAGNOSIS — Z8249 Family history of ischemic heart disease and other diseases of the circulatory system: Secondary | ICD-10-CM | POA: Insufficient documentation

## 2020-06-25 DIAGNOSIS — Z3689 Encounter for other specified antenatal screening: Secondary | ICD-10-CM | POA: Diagnosis not present

## 2020-06-25 LAB — POCT FERN TEST: POCT Fern Test: NEGATIVE

## 2020-06-25 NOTE — Discharge Instructions (Signed)

## 2020-06-25 NOTE — MAU Provider Note (Signed)
History   [id number]   Chief Complaint  Patient presents with  . Contractions  . Rupture of Membranes   HPI Jaime Payne is a 25 y.o. female  G2P0010 @39 .4 wks here with report of small gush of fluid while in the shower around 6pm.  Leaking of fluid has not continued. Pt reports contractions earlier but not now. She denies vaginal bleeding. Last intercourse was not recent. She reports + fetal movement. All other systems negative.    No LMP recorded. Patient is pregnant.  OB History  Gravida Para Term Preterm AB Living  2       1    SAB IAB Ectopic Multiple Live Births    1          # Outcome Date GA Lbr Len/2nd Weight Sex Delivery Anes PTL Lv  2 Current           1 IAB 01/03/19            Past Medical History:  Diagnosis Date  . Anxiety   . Asthma   . Depression   . Leukemia (Derma)     Family History  Problem Relation Age of Onset  . Anxiety disorder Mother   . Diabetes Mother   . Hypertension Mother   . Diabetes Father   . Hypertension Father     Social History   Socioeconomic History  . Marital status: Single    Spouse name: Not on file  . Number of children: Not on file  . Years of education: Not on file  . Highest education level: Not on file  Occupational History  . Not on file  Tobacco Use  . Smoking status: Never Smoker  . Smokeless tobacco: Never Used  Vaping Use  . Vaping Use: Never used  Substance and Sexual Activity  . Alcohol use: Never  . Drug use: Never  . Sexual activity: Yes  Other Topics Concern  . Not on file  Social History Narrative  . Not on file   Social Determinants of Health   Financial Resource Strain: Not on file  Food Insecurity: Not on file  Transportation Needs: Not on file  Physical Activity: Not on file  Stress: Not on file  Social Connections: Not on file    No Known Allergies  No current facility-administered medications on file prior to encounter.   Current Outpatient Medications on File Prior to  Encounter  Medication Sig Dispense Refill  . budesonide (PULMICORT) 180 MCG/ACT inhaler Inhale into the lungs 2 (two) times daily.    . cyclobenzaprine (FLEXERIL) 10 MG tablet Take 1 tablet (10 mg total) by mouth 2 (two) times daily as needed for muscle spasms. 20 tablet 0  . ferrous sulfate 325 (65 FE) MG tablet Take 325 mg by mouth daily with breakfast.    . Prenatal Vit-Fe Fumarate-FA (PRENATAL MULTIVITAMIN) TABS tablet Take 1 tablet by mouth daily at 12 noon.     Review of Systems  Gastrointestinal: Negative for abdominal pain.  Genitourinary: Positive for vaginal discharge. Negative for vaginal bleeding.   Physical Exam   Vitals:   06/25/20 2050 06/25/20 2055 06/25/20 2100 06/25/20 2101  BP:    130/86  Pulse:    97  Resp:      Temp:      SpO2: 98% 97% 98%   Weight:      Height:        Physical Exam Vitals and nursing note reviewed. Exam conducted with a chaperone  present.  Constitutional:      General: She is not in acute distress.    Appearance: Normal appearance.  HENT:     Head: Normocephalic and atraumatic.  Pulmonary:     Effort: Pulmonary effort is normal. No respiratory distress.  Genitourinary:    Comments: SSE: no pool, fern neg Musculoskeletal:        General: Normal range of motion.     Cervical back: Normal range of motion.  Skin:    General: Skin is warm and dry.  Neurological:     General: No focal deficit present.     Mental Status: She is alert and oriented to person, place, and time.  Psychiatric:        Mood and Affect: Mood normal.        Behavior: Behavior normal.   EFM: 155 bpm, mod variability, + accels, no decels Toco: UI  Results for orders placed or performed during the hospital encounter of 06/25/20 (from the past 24 hour(s))  Fern Test     Status: Normal   Collection Time: 06/25/20  8:06 PM  Result Value Ref Range   POCT Fern Test Negative = intact amniotic membranes    MAU Course  Procedures  MDM Labs ordered and reviewed.  No signs of labor or SROM. Stable for discharge home.   Assessment and Plan   1. [redacted] weeks gestation of pregnancy   2. NST (non-stress test) reactive   3. Vaginal discharge during pregnancy in third trimester    Discharge home Follow up at [facility] as scheduled Labor precautions  Allergies as of 06/25/2020   No Known Allergies     Medication List    STOP taking these medications   amoxicillin 500 MG capsule Commonly known as: AMOXIL   cefadroxil 500 MG capsule Commonly known as: DURICEF     TAKE these medications   budesonide 180 MCG/ACT inhaler Commonly known as: PULMICORT Inhale into the lungs 2 (two) times daily.   cyclobenzaprine 10 MG tablet Commonly known as: FLEXERIL Take 1 tablet (10 mg total) by mouth 2 (two) times daily as needed for muscle spasms.   ferrous sulfate 325 (65 FE) MG tablet Take 325 mg by mouth daily with breakfast.   prenatal multivitamin Tabs tablet Take 1 tablet by mouth daily at 12 noon.       Julianne Handler, CNM 06/25/2020 9:53 PM

## 2020-06-25 NOTE — MAU Note (Signed)
Ctxs since 0700 today. Past 2 hours more consistent. Took a shower an hour ago and some fld came out while I was in the shower. No fld leaking since then. Good FM

## 2020-06-28 ENCOUNTER — Inpatient Hospital Stay (HOSPITAL_COMMUNITY)
Admission: AD | Admit: 2020-06-28 | Discharge: 2020-06-30 | DRG: 806 | Disposition: A | Discharge status: Home / Self Care | Payer: Medicaid Other | Attending: Obstetrics and Gynecology | Admitting: Obstetrics and Gynecology

## 2020-06-28 ENCOUNTER — Other Ambulatory Visit: Payer: Self-pay

## 2020-06-28 ENCOUNTER — Inpatient Hospital Stay (HOSPITAL_COMMUNITY): Payer: Medicaid Other | Admitting: Anesthesiology

## 2020-06-28 ENCOUNTER — Encounter (HOSPITAL_COMMUNITY): Payer: Self-pay | Admitting: Obstetrics and Gynecology

## 2020-06-28 DIAGNOSIS — O99824 Streptococcus B carrier state complicating childbirth: Secondary | ICD-10-CM | POA: Diagnosis present

## 2020-06-28 DIAGNOSIS — Z23 Encounter for immunization: Secondary | ICD-10-CM | POA: Diagnosis not present

## 2020-06-28 DIAGNOSIS — O99892 Other specified diseases and conditions complicating childbirth: Secondary | ICD-10-CM | POA: Diagnosis not present

## 2020-06-28 DIAGNOSIS — Z20822 Contact with and (suspected) exposure to covid-19: Secondary | ICD-10-CM | POA: Diagnosis present

## 2020-06-28 DIAGNOSIS — O9081 Anemia of the puerperium: Secondary | ICD-10-CM | POA: Diagnosis not present

## 2020-06-28 DIAGNOSIS — R000 Tachycardia, unspecified: Secondary | ICD-10-CM | POA: Diagnosis not present

## 2020-06-28 DIAGNOSIS — Z3A40 40 weeks gestation of pregnancy: Secondary | ICD-10-CM | POA: Diagnosis not present

## 2020-06-28 DIAGNOSIS — R030 Elevated blood-pressure reading, without diagnosis of hypertension: Secondary | ICD-10-CM | POA: Diagnosis not present

## 2020-06-28 DIAGNOSIS — O134 Gestational [pregnancy-induced] hypertension without significant proteinuria, complicating childbirth: Principal | ICD-10-CM | POA: Diagnosis present

## 2020-06-28 DIAGNOSIS — O99214 Obesity complicating childbirth: Secondary | ICD-10-CM | POA: Diagnosis not present

## 2020-06-28 LAB — COMPREHENSIVE METABOLIC PANEL
ALT: 15 U/L (ref 0–44)
AST: 24 U/L (ref 15–41)
Albumin: 2.6 g/dL — ABNORMAL LOW (ref 3.5–5.0)
Alkaline Phosphatase: 217 U/L — ABNORMAL HIGH (ref 38–126)
Anion gap: 9 (ref 5–15)
BUN: 8 mg/dL (ref 6–20)
CO2: 18 mmol/L — ABNORMAL LOW (ref 22–32)
Calcium: 9.1 mg/dL (ref 8.9–10.3)
Chloride: 107 mmol/L (ref 98–111)
Creatinine, Ser: 0.67 mg/dL (ref 0.44–1.00)
GFR, Estimated: >60 mL/min (ref >60)
Glucose, Bld: 92 mg/dL (ref 70–99)
Potassium: 4.2 mmol/L (ref 3.5–5.1)
Sodium: 134 mmol/L — ABNORMAL LOW (ref 135–145)
Total Bilirubin: 0.4 mg/dL (ref 0.3–1.2)
Total Protein: 6 g/dL — ABNORMAL LOW (ref 6.5–8.1)

## 2020-06-28 LAB — RESP PANEL BY RT-PCR (FLU A&B, COVID) ARPGX2
Influenza A by PCR: NEGATIVE
Influenza B by PCR: NEGATIVE
SARS Coronavirus 2 by RT PCR: NEGATIVE

## 2020-06-28 LAB — CBC
HCT: 33.5 % — ABNORMAL LOW (ref 36.0–46.0)
Hemoglobin: 11.2 g/dL — ABNORMAL LOW (ref 12.0–15.0)
MCH: 29.9 pg (ref 26.0–34.0)
MCHC: 33.4 g/dL (ref 30.0–36.0)
MCV: 89.6 fL (ref 80.0–100.0)
Platelets: 238 10*3/uL (ref 150–400)
RBC: 3.74 MIL/uL — ABNORMAL LOW (ref 3.87–5.11)
RDW: 14.6 % (ref 11.5–15.5)
WBC: 11.4 10*3/uL — ABNORMAL HIGH (ref 4.0–10.5)
nRBC: 0 % (ref 0.0–0.2)

## 2020-06-28 LAB — PROTEIN / CREATININE RATIO, URINE
Creatinine, Urine: 77.26 mg/dL
Protein Creatinine Ratio: 0.16 mg/mg{Cre} — ABNORMAL HIGH (ref 0.00–0.15)
Total Protein, Urine: 12 mg/dL

## 2020-06-28 LAB — SYPHILIS: RPR W/REFLEX TO RPR TITER AND TREPONEMAL ANTIBODIES, TRADITIONAL SCREENING AND DIAGNOSIS ALGORITHM: RPR Ser Ql: NONREACTIVE

## 2020-06-28 LAB — TYPE AND SCREEN
ABO/RH(D): O POS
Antibody Screen: NEGATIVE

## 2020-06-28 MED ORDER — OXYCODONE-ACETAMINOPHEN 5-325 MG PO TABS
2.0000 | ORAL_TABLET | ORAL | Status: DC | PRN
Start: 2020-06-28 — End: 2020-06-29

## 2020-06-28 MED ORDER — OXYTOCIN-SODIUM CHLORIDE 30-0.9 UT/500ML-% IV SOLN
2.5000 [IU]/h | INTRAVENOUS | Status: DC
  Administered 2020-06-28: 2.5 [IU]/h via INTRAVENOUS
  Filled 2020-06-28: qty 500

## 2020-06-28 MED ORDER — SODIUM CHLORIDE 0.9 % IV SOLN
2.0000 g | Freq: Once | INTRAVENOUS | Status: AC
  Administered 2020-06-28: 2 g via INTRAVENOUS
  Filled 2020-06-28: qty 2000

## 2020-06-28 MED ORDER — OXYTOCIN BOLUS FROM INFUSION
333.0000 mL | Freq: Once | INTRAVENOUS | Status: AC
  Administered 2020-06-28: 333 mL via INTRAVENOUS

## 2020-06-28 MED ORDER — ONDANSETRON HCL 4 MG/2ML IJ SOLN
4.0000 mg | Freq: Four times a day (QID) | INTRAMUSCULAR | Status: DC | PRN
  Administered 2020-06-28: 4 mg via INTRAVENOUS
  Filled 2020-06-28: qty 2

## 2020-06-28 MED ORDER — LACTATED RINGERS IV SOLN: 500.0000 mL | Freq: Once | INTRAVENOUS | Status: DC

## 2020-06-28 MED ORDER — GENTAMICIN SULFATE 40 MG/ML IJ SOLN
5.0000 mg/kg | Freq: Once | INTRAVENOUS | Status: AC
  Administered 2020-06-28: 370 mg via INTRAVENOUS
  Filled 2020-06-28: qty 9.25

## 2020-06-28 MED ORDER — DIPHENHYDRAMINE HCL 50 MG/ML IJ SOLN: 12.5000 mg | INTRAMUSCULAR | Status: DC | PRN

## 2020-06-28 MED ORDER — ACETAMINOPHEN 325 MG PO TABS
650.0000 mg | ORAL_TABLET | ORAL | Status: DC | PRN
  Administered 2020-06-28: 650 mg via ORAL
  Filled 2020-06-28: qty 2

## 2020-06-28 MED ORDER — EPHEDRINE 5 MG/ML INJ: 10.0000 mg | INTRAVENOUS | Status: DC | PRN

## 2020-06-28 MED ORDER — SOD CITRATE-CITRIC ACID 500-334 MG/5ML PO SOLN: 30.0000 mL | ORAL | Status: DC | PRN

## 2020-06-28 MED ORDER — LACTATED RINGERS IV SOLN: INTRAVENOUS | Status: DC

## 2020-06-28 MED ORDER — LACTATED RINGERS IV SOLN
500.0000 mL | INTRAVENOUS | Status: DC | PRN
Start: 2020-06-28 — End: 2020-06-29
  Administered 2020-06-28: 500 mL via INTRAVENOUS

## 2020-06-28 MED ORDER — SODIUM CHLORIDE 0.9 % IV SOLN
5.0000 10*6.[IU] | Freq: Once | INTRAVENOUS | Status: AC
  Administered 2020-06-28: 5 10*6.[IU] via INTRAVENOUS
  Filled 2020-06-28: qty 5

## 2020-06-28 MED ORDER — PHENYLEPHRINE 40 MCG/ML (10ML) SYRINGE FOR IV PUSH (FOR BLOOD PRESSURE SUPPORT)
80.0000 ug | PREFILLED_SYRINGE | INTRAVENOUS | Status: DC | PRN
  Filled 2020-06-28: qty 10

## 2020-06-28 MED ORDER — MISOPROSTOL 200 MCG PO TABS
ORAL_TABLET | ORAL | Status: AC
  Filled 2020-06-28: qty 4

## 2020-06-28 MED ORDER — LIDOCAINE HCL (PF) 1 % IJ SOLN: 30.0000 mL | INTRAMUSCULAR | Status: DC | PRN

## 2020-06-28 MED ORDER — LIDOCAINE-EPINEPHRINE (PF) 2 %-1:200000 IJ SOLN
INTRAMUSCULAR | Status: DC | PRN
  Administered 2020-06-28: 4 mL via EPIDURAL

## 2020-06-28 MED ORDER — MISOPROSTOL 200 MCG PO TABS
800.0000 ug | ORAL_TABLET | Freq: Once | ORAL | Status: AC
  Administered 2020-06-28: 800 ug via VAGINAL

## 2020-06-28 MED ORDER — OXYTOCIN-SODIUM CHLORIDE 30-0.9 UT/500ML-% IV SOLN
1.0000 m[IU]/min | INTRAVENOUS | Status: DC
  Administered 2020-06-28: 2 m[IU]/min via INTRAVENOUS
  Filled 2020-06-28: qty 500

## 2020-06-28 MED ORDER — FENTANYL CITRATE (PF) 100 MCG/2ML IJ SOLN: 50.0000 ug | INTRAMUSCULAR | Status: DC | PRN

## 2020-06-28 MED ORDER — PHENYLEPHRINE 40 MCG/ML (10ML) SYRINGE FOR IV PUSH (FOR BLOOD PRESSURE SUPPORT): 80.0000 ug | PREFILLED_SYRINGE | INTRAVENOUS | Status: DC | PRN

## 2020-06-28 MED ORDER — TERBUTALINE SULFATE 1 MG/ML IJ SOLN: 0.2500 mg | Freq: Once | INTRAMUSCULAR | Status: DC | PRN

## 2020-06-28 MED ORDER — FENTANYL-BUPIVACAINE-NACL 0.5-0.125-0.9 MG/250ML-% EP SOLN
12.0000 mL/h | EPIDURAL | Status: DC | PRN
  Administered 2020-06-28: 12 mL/h via EPIDURAL
  Filled 2020-06-28: qty 250

## 2020-06-28 MED ORDER — PENICILLIN G POT IN DEXTROSE 60000 UNIT/ML IV SOLN
3.0000 10*6.[IU] | INTRAVENOUS | Status: DC
  Administered 2020-06-28 (×2): 3 10*6.[IU] via INTRAVENOUS
  Filled 2020-06-28 (×3): qty 50

## 2020-06-28 MED ORDER — TRANEXAMIC ACID-NACL 1000-0.7 MG/100ML-% IV SOLN
1000.0000 mg | Freq: Once | INTRAVENOUS | Status: AC
  Administered 2020-06-28: 1000 mg via INTRAVENOUS

## 2020-06-28 MED ORDER — TRANEXAMIC ACID-NACL 1000-0.7 MG/100ML-% IV SOLN
INTRAVENOUS | Status: AC
  Filled 2020-06-28: qty 100

## 2020-06-28 MED ORDER — OXYCODONE-ACETAMINOPHEN 5-325 MG PO TABS: 1.0000 | ORAL_TABLET | ORAL | Status: DC | PRN

## 2020-06-28 NOTE — H&P (Signed)
Jaime Payne is a 25 y.o. female G2P0010 [redacted]w[redacted]d presented to MAU for painful contractions that started this morning, q 5 mins. No LOF or VB. Good fetal movement.   Initial exam in MAU 3/80/-2, repeat after 90 mins was 3/90/-2. During labor evaluation, multiple elevated Bps noted, 138/97, 137/96, 144/87  Pregnancy c/b: 1. GBS positive (in first trimester urine) 2. Chlamydia infection in first trimester, test of cure and 36 week follow up testing was negative 3. Anemia: hgb 10.9 at 36 weeks 4 Obesity: prepregnancy BMI 30.2   OB History    Gravida  2   Para      Term      Preterm      AB  1   Living        SAB      IAB  1   Ectopic      Multiple      Live Births             Past Medical History:  Diagnosis Date  . Anxiety   . Asthma   . Depression   . Leukemia [facility])    age 28.5   Past Surgical History:  Procedure Laterality Date  . PORTACATH PLACEMENT    . RHINOPLASTY  2019   Family History: family history includes Anxiety disorder in her mother; Birth defects in her maternal grandfather and paternal grandfather; Diabetes in her father and mother; Hyperlipidemia in her father; Hypertension in her father and mother. Social History:  reports that she has never smoked. She has never used smokeless tobacco. She reports that she does not drink alcohol and does not use drugs.     Maternal Diabetes: No Genetic Screening: Normal Maternal Ultrasounds/Referrals: Normal Fetal Ultrasounds or other Referrals:  None Maternal Substance Abuse:  No Significant Maternal Medications:  None Significant Maternal Lab Results:  Group B Strep positive Other Comments:  None  Review of Systems Per HPI Exam Physical Exam  Dilation: 3 Effacement (%): 90 Station: -2 Exam by:: Jaymes Graff RN Blood pressure 125/71, pulse (!) 105, temperature 99 F (37.2 C), temperature source Axillary, resp. rate 20, height 5\' 5"  (1.651 m), weight 98.9 kg, SpO2 97 %. NAD, uncomfortable  with contractions Gravid abdomen, EFW 7# by [place] Fetal testing: FHR 135bpm, moderate variability, + accels, no decels Toco: ctx q 1-4 mins  Prenatal labs: ABO, Rh:  --/--/O POS (05/27 2500) Antibody: NEG (05/27 3704) Rubella: Nonimmune (10/27 0000) RPR: NON REACTIVE (05/27 0833)  HBsAg: Negative (10/27 0000)  HIV: Non-reactive (10/27 0000)  GBS: Positive/-- (10/27 0000)   Assessment/Plan: 24Y G2P0010 @ [redacted]w[redacted]d, early labor with elevated blood pressures 1. Given 40 weeks and elevated Bps, admit and augment labor if needed 2. Early labor: currently contracting regularly, add pitocin if needed for augmentation 3. GBS positive: start pencillin 4. Elevated Bps: no elevated blood pressure prenatally. Admission labs normal, urine p/c pending. Does not currently meet gestational HTN criteria, continue to monitor 5. Anemia: resolved, admission Hgb 11.2 6. Pain control: epidural requested   Rowland Lathe 06/28/2020, 11:33 AM

## 2020-06-28 NOTE — Plan of Care (Signed)

## 2020-06-28 NOTE — Anesthesia Procedure Notes (Signed)

## 2020-06-28 NOTE — MAU Provider Note (Signed)
History     CSN: [id number]  Arrival date and time: 06/28/20 4098  Event Date/Time  First Provider Initiated Contact with Patient 06/28/20 [id number]     Chief Complaint  Patient presents with  . Contractions   HPI Jaime Payne is a 25 y.o. G2P0010 at [redacted]w[redacted]d who presents to MAU with chief complaint of contractions. This is a new problem, onset early this morning. Patient endorses contractions q 5 minutes, pain score 8-9/10. She denies vaginal bleeding, leaking of fluid, decreased fetal movement, fever, falls, or recent illness.   Patient denies history of elevated blood pressure. She denies headache, visual disturbances, RUQ/epigastric pain, new onset swelling or weight gain.  Patient receies care with [facility].  OB History    Gravida  2   Para      Term      Preterm      AB  1   Living        SAB      IAB  1   Ectopic      Multiple      Live Births              Past Medical History:  Diagnosis Date  . Anxiety   . Asthma   . Depression   . Leukemia [facility])    age 30.5    Past Surgical History:  Procedure Laterality Date  . PORTACATH PLACEMENT    . RHINOPLASTY  2019    Family History  Problem Relation Age of Onset  . Anxiety disorder Mother   . Diabetes Mother   . Hypertension Mother   . Diabetes Father   . Hypertension Father   . Hyperlipidemia Father   . Birth defects Maternal Grandfather   . Birth defects Paternal Grandfather        liver    Social History   Tobacco Use  . Smoking status: Never Smoker  . Smokeless tobacco: Never Used  Vaping Use  . Vaping Use: Never used  Substance Use Topics  . Alcohol use: Never  . Drug use: Never    Allergies: No Known Allergies  Medications Prior to Admission  Medication Sig Dispense Refill Last Dose  . cyclobenzaprine (FLEXERIL) 10 MG tablet Take 1 tablet (10 mg total) by mouth 2 (two) times daily as needed for muscle spasms. 20 tablet 0 Past Week at Unknown time  . ferrous sulfate  325 (65 FE) MG tablet Take 325 mg by mouth daily with breakfast.   06/27/2020 at Unknown time  . Prenatal Vit-Fe Fumarate-FA (PRENATAL MULTIVITAMIN) TABS tablet Take 1 tablet by mouth daily at 12 noon.   06/27/2020 at Unknown time  . budesonide (PULMICORT) 180 MCG/ACT inhaler Inhale into the lungs 2 (two) times daily.   06/26/2020    Review of Systems  Constitutional: Negative for fever.  Eyes: Negative for visual disturbance.  Gastrointestinal: Positive for abdominal pain.  Genitourinary: Negative for vaginal bleeding and vaginal discharge.  Neurological: Negative for headaches.  All other systems reviewed and are negative.  Physical Exam   Blood pressure (!) 138/97, pulse (!) 104, temperature 98.3 F (36.8 C), resp. rate 20.  Physical Exam Vitals and nursing note reviewed. Exam conducted with a chaperone present.  Constitutional:      Appearance: Normal appearance.  Cardiovascular:     Pulses: Normal pulses.  Pulmonary:     Effort: Pulmonary effort is normal.  Abdominal:     Comments: Gravid  Skin:    Capillary Refill:  Capillary refill takes less than 2 seconds.  Neurological:     Mental Status: She is alert and oriented to person, place, and time.  Psychiatric:        Behavior: Behavior normal.        Thought Content: Thought content normal.        Judgment: Judgment normal.     MAU Course  Procedures  --New onset mild HTN. No severe range, no severe symptoms. PEC labs ordered  --CNM at bedside at 0920 for recheck of cervix, 90 minutes after initial exam. Patient endorses more intense contractions, intermittently moaning.   --Reactive tracing: baseline 135, mod var, + 15 x 15 accels, no decels  --Toco: Irregular contractions q 2-4 min  --New onset mild HTN x 1 hour, GBS +, at [redacted]w[redacted]d. Discussed with Dr. Elly Modena and Dr. Brien Mates, who both agree with my recommendation for admission.   Patient Vitals for the past 24 hrs:  BP Temp Pulse Resp SpO2 Height Weight   06/28/20 0927 -- -- -- -- -- 5\' 5"  (1.651 m) 98.9 kg  06/28/20 0922 -- -- -- -- 99 % -- --  06/28/20 0921 127/83 -- (!) 108 -- -- -- --  06/28/20 0831 (!) 137/96 -- (!) 104 -- -- -- --  06/28/20 0820 -- -- -- -- 99 % -- --  06/28/20 0816 (!) 141/93 -- 97 -- -- -- --  06/28/20 0806 (!) 139/95 -- 99 -- -- -- --  06/28/20 0758 (!) 138/97 -- (!) 104 -- -- -- --  06/28/20 0739 133/89 98.3 F (36.8 C) (!) 115 20 -- -- --    Assessment and Plan  --25 y.o. G2P0010 at [redacted]w[redacted]d  --Reactive tracing --3/57-01/-7, cephalic by suture --GBS + --HTN, PEC labs in work --Per Dr. Brien Mates, admit to L&D  Darlina Rumpf, CNM 06/28/2020, 10:16 AM

## 2020-06-28 NOTE — Lactation Note (Signed)
This note was copied from a baby's chart. Lactation Consultation Note  Patient Name: Jaime Payne Today's Date: 06/28/2020 Reason for consult: L&D Initial assessment;1st time breastfeeding;Term Age:23 hours LC entered the room, infant was cuing to breastfeed. Mom made multiple attempts to latch infant at the breast, infant did not sustain latch, mom has nipples that are flat and invert inward when touch. LC discussed hand expression and infant was given 8 mls of colostrum that mom hand express. Mom can benefit from having breast shells and hand pump to pre-pump breast prior to latching infant LC will inform RN on MBU.. Mom brought her own DEBP from home. Mom may possibly  need NS due to degree of flatness. Mom knows to breastfeed infant according to primal cues: licking, kissing, smacking, rooting, hands and fist in mouth, and BF infant STS.   Maternal Data Has patient been taught Hand Expression?: Yes  Feeding Mother's Current Feeding Choice: Breast Milk  LATCH Score Latch: Repeated attempts needed to sustain latch, nipple held in mouth throughout feeding, stimulation needed to elicit sucking reflex.  Audible Swallowing: None  Type of Nipple: Flat  Comfort (Breast/Nipple): Soft / non-tender  Hold (Positioning): Assistance needed to correctly position infant at breast and maintain latch.  LATCH Score: 5   Lactation Tools Discussed/Used    Interventions Interventions: Assisted with latch;Skin to skin;Hand express;Breast compression;Adjust position;Support pillows;Position options;Expressed milk  Discharge Pump: Personal WIC Program: Yes  Consult Status Consult Status: Follow-up Date: 06/29/20 Follow-up type: In-patient    Jaime Payne 06/28/2020, 10:38 PM

## 2020-06-28 NOTE — Progress Notes (Signed)
Interval note  Patient developed fever, T 100.92F less than 10 mins prior to delivery. Repeat temp 100.51F, then 100.1F. Maternal tachycardia in 110s noted. Fetal heart rate never tachycardic during pushing.  Ordered IV ampicillin/gentamicin and monitor temp/vitals closely.   Luther Redo, MD 06/28/20 11:14 PM

## 2020-06-28 NOTE — Anesthesia Preprocedure Evaluation (Signed)
Anesthesia Evaluation  Patient identified by MRN, date of birth, ID band Patient awake    Reviewed: Allergy & Precautions, NPO status , Patient's Chart, lab work & pertinent test results  Airway Mallampati: III  TM Distance: >3 FB Neck ROM: Full    Dental no notable dental hx.    Pulmonary asthma ,    Pulmonary exam normal breath sounds clear to auscultation       Cardiovascular hypertension (new onset gHTN), Normal cardiovascular exam Rhythm:Regular Rate:Normal     Neuro/Psych PSYCHIATRIC DISORDERS Anxiety Depression negative neurological ROS     GI/Hepatic negative GI ROS, Neg liver ROS,   Endo/Other  negative endocrine ROS  Renal/GU negative Renal ROS  negative genitourinary   Musculoskeletal negative musculoskeletal ROS (+)   Abdominal   Peds  Hematology negative hematology ROS (+)   Anesthesia Other Findings Presented in labor  Reproductive/Obstetrics (+) Pregnancy                             Anesthesia Physical Anesthesia Plan  ASA: III  Anesthesia Plan: Epidural   Post-op Pain Management:    Induction:   PONV Risk Score and Plan: Treatment may vary due to age or medical condition  Airway Management Planned: Natural Airway  Additional Equipment:   Intra-op Plan:   Post-operative Plan:   Informed Consent: I have reviewed the patients History and Physical, chart, labs and discussed the procedure including the risks, benefits and alternatives for the proposed anesthesia with the patient or authorized representative who has indicated his/her understanding and acceptance.       Plan Discussed with: Anesthesiologist  Anesthesia Plan Comments: (Patient identified. Risks, benefits, options discussed with patient including but not limited to bleeding, infection, nerve damage, paralysis, failed block, incomplete pain control, headache, blood pressure changes, nausea,  vomiting, reactions to medication, itching, and post partum back pain. Confirmed with bedside nurse the patient's most recent platelet count. Confirmed with the patient that they are not taking any anticoagulation, have any bleeding history or any family history of bleeding disorders. Patient expressed understanding and wishes to proceed. All questions were answered. )        Anesthesia Quick Evaluation

## 2020-06-28 NOTE — Anesthesia Postprocedure Evaluation (Signed)
Anesthesia Post Note  Patient: Jaime Payne  Procedure(s) Performed: AN AD HOC LABOR EPIDURAL     Patient location during evaluation: Mother Baby Anesthesia Type: Epidural Level of consciousness: awake and alert Pain management: pain level controlled Vital Signs Assessment: post-procedure vital signs reviewed and stable Respiratory status: spontaneous breathing, nonlabored ventilation and respiratory function stable Cardiovascular status: stable Postop Assessment: no headache, no backache and epidural receding Anesthetic complications: no   No complications documented.  Last Vitals:  Vitals:   06/28/20 2053 06/28/20 2115  BP:  122/72  Pulse:  (!) 132  Resp:    Temp: (!) 38.2 C   SpO2:      Last Pain:  Vitals:   06/28/20 2053  TempSrc: Axillary  PainSc:    Pain Goal:                   [name]

## 2020-06-28 NOTE — MAU Note (Signed)
Pains woke her at 0230, now every 7 min, "really hurts'. No bleeding or leaking. Was 2 cm yesterday. No problems with preg.

## 2020-06-29 LAB — CBC
HCT: 25.8 % — ABNORMAL LOW (ref 36.0–46.0)
Hemoglobin: 8.5 g/dL — ABNORMAL LOW (ref 12.0–15.0)
MCH: 29.7 pg (ref 26.0–34.0)
MCHC: 32.9 g/dL (ref 30.0–36.0)
MCV: 90.2 fL (ref 80.0–100.0)
Platelets: 211 10*3/uL (ref 150–400)
RBC: 2.86 MIL/uL — ABNORMAL LOW (ref 3.87–5.11)
RDW: 14.9 % (ref 11.5–15.5)
WBC: 12.9 10*3/uL — ABNORMAL HIGH (ref 4.0–10.5)
nRBC: 0 % (ref 0.0–0.2)

## 2020-06-29 MED ORDER — BISACODYL 10 MG RE SUPP: 10.0000 mg | Freq: Every day | RECTAL | Status: DC | PRN

## 2020-06-29 MED ORDER — FLEET ENEMA 7-19 GM/118ML RE ENEM: 1.0000 | ENEMA | Freq: Every day | RECTAL | Status: DC | PRN

## 2020-06-29 MED ORDER — FERROUS SULFATE 325 (65 FE) MG PO TABS
325.0000 mg | ORAL_TABLET | Freq: Every day | ORAL | Status: DC
  Administered 2020-06-30: 325 mg via ORAL
  Filled 2020-06-29 (×2): qty 1

## 2020-06-29 MED ORDER — ONDANSETRON HCL 4 MG/2ML IJ SOLN: 4.0000 mg | INTRAMUSCULAR | Status: DC | PRN

## 2020-06-29 MED ORDER — BENZOCAINE-MENTHOL 20-0.5 % EX AERO
1.0000 "application " | INHALATION_SPRAY | CUTANEOUS | Status: DC | PRN
  Administered 2020-06-29: 1 via TOPICAL
  Filled 2020-06-29: qty 56

## 2020-06-29 MED ORDER — PRENATAL MULTIVITAMIN CH
1.0000 | ORAL_TABLET | Freq: Every day | ORAL | Status: DC
  Administered 2020-06-29 – 2020-06-30 (×2): 1 via ORAL
  Filled 2020-06-29 (×2): qty 1

## 2020-06-29 MED ORDER — WITCH HAZEL-GLYCERIN EX PADS
1.0000 | MEDICATED_PAD | CUTANEOUS | Status: DC | PRN
Start: 2020-06-28 — End: 2020-06-30

## 2020-06-29 MED ORDER — TETANUS-DIPHTH-ACELL PERTUSSIS 5-2.5-18.5 LF-MCG/0.5 IM SUSY: 0.5000 mL | PREFILLED_SYRINGE | Freq: Once | INTRAMUSCULAR | Status: DC

## 2020-06-29 MED ORDER — ZOLPIDEM TARTRATE 5 MG PO TABS: 5.0000 mg | ORAL_TABLET | Freq: Every evening | ORAL | Status: DC | PRN

## 2020-06-29 MED ORDER — DIPHENHYDRAMINE HCL 25 MG PO CAPS: 25.0000 mg | ORAL_CAPSULE | Freq: Four times a day (QID) | ORAL | Status: DC | PRN

## 2020-06-29 MED ORDER — DIBUCAINE (PERIANAL) 1 % EX OINT: 1.0000 "application " | TOPICAL_OINTMENT | CUTANEOUS | Status: DC | PRN

## 2020-06-29 MED ORDER — SENNOSIDES-DOCUSATE SODIUM 8.6-50 MG PO TABS
2.0000 | ORAL_TABLET | Freq: Every day | ORAL | Status: DC
  Filled 2020-06-29: qty 2

## 2020-06-29 MED ORDER — ONDANSETRON HCL 4 MG PO TABS: 4.0000 mg | ORAL_TABLET | ORAL | Status: DC | PRN

## 2020-06-29 MED ORDER — OXYCODONE HCL 5 MG PO TABS: 10.0000 mg | ORAL_TABLET | ORAL | Status: DC | PRN

## 2020-06-29 MED ORDER — ACETAMINOPHEN 325 MG PO TABS
650.0000 mg | ORAL_TABLET | ORAL | Status: DC | PRN
  Administered 2020-06-29 (×3): 650 mg via ORAL
  Filled 2020-06-29 (×3): qty 2

## 2020-06-29 MED ORDER — IBUPROFEN 600 MG PO TABS
600.0000 mg | ORAL_TABLET | Freq: Four times a day (QID) | ORAL | Status: DC
  Administered 2020-06-29 – 2020-06-30 (×7): 600 mg via ORAL
  Filled 2020-06-29 (×7): qty 1

## 2020-06-29 MED ORDER — SIMETHICONE 80 MG PO CHEW: 80.0000 mg | CHEWABLE_TABLET | ORAL | Status: DC | PRN

## 2020-06-29 MED ORDER — BUDESONIDE 0.25 MG/2ML IN SUSP: 0.2500 mg | Freq: Two times a day (BID) | RESPIRATORY_TRACT | Status: DC | PRN

## 2020-06-29 MED ORDER — OXYCODONE HCL 5 MG PO TABS: 5.0000 mg | ORAL_TABLET | ORAL | Status: DC | PRN

## 2020-06-29 MED ORDER — COCONUT OIL OIL
1.0000 | TOPICAL_OIL | Status: DC | PRN
Start: 2020-06-29 — End: 2020-06-30

## 2020-06-29 NOTE — Lactation Note (Signed)
This note was copied from a baby's chart. Lactation Consultation Note  Patient Name: Jaime Payne HDTPN'S Date: 06/29/2020 Reason for consult: Follow-up assessment;1st time breastfeeding;Term;[place] Age:23 hours  P1 mother whose infant is now 25 hours old.  This is a term baby at 40+0 weeks.  Mother interested in trying to latch baby.  She tried at 0400 and 0500 but baby was too sleepy.  Baby awakens nicely and suck assessed to be strong and rhythmic. Mother demonstrated hand expression and colostrum drops fed to baby.  Assisted to latch in the football hold and baby latched, however, would not initiate sucking.  Demonstrated breast compressions and gentle stimulation.  Baby is currently too sleepy to feed.  Reassurance provided.  Mother will call for assistance as needed.  RN updated.   Maternal Data Has patient been taught Hand Expression?: Yes  Feeding Mother's Current Feeding Choice: Breast Milk  LATCH Score Latch: Too sleepy or reluctant, no latch achieved, no sucking elicited.  Audible Swallowing: None  Type of Nipple: Everted at rest and after stimulation (short shafted)  Comfort (Breast/Nipple): Soft / non-tender  Hold (Positioning): Assistance needed to correctly position infant at breast and maintain latch.  LATCH Score: 5   Lactation Tools Discussed/Used Tools: Pump Nipple shield size: 20 Breast pump type: Manual Reason for Pumping: Nipple eversion Pumping frequency: prior to feeding  Interventions Interventions: Breast feeding basics reviewed;Assisted with latch;Skin to skin;Breast compression;Adjust position;Position options;Support pillows;Education  Discharge Pump: Manual  Consult Status Consult Status: Follow-up Date: 06/30/20 Follow-up type: In-patient    Jaime Payne R Jaime Payne 06/29/2020, 8:16 AM

## 2020-06-29 NOTE — Progress Notes (Signed)
CSW met with MOB to complete consult for history of anxiety and depression. CSW observed MOB resting in bed, infant in bassinet, and MOB's father at bedside. MOB gave CSW verbal consent to complete consult while MOB's father was present. CSW explained role and reason for consult. MOB was pleasant, polite, and engaged with CSW. MOB reported, around three years ago she started experiencing anxiety and depression symptoms. MOB reported, she has been able to manage symptoms with no medication.   CSW provided education regarding the baby blues period vs. perinatal mood disorders, discussed treatment and gave resources for mental health follow up if concerns arise. CSW recommends self- evaluation during the postpartum time period using the New Mom Checklist from Postpartum Progress and encouraged MOB to contact a medical professional if symptoms are noted at any time.   When CSW asked MOB of her emotions since delivery. MOB reported, she feels "exhausted, but fine overall". MOB reported, FOB and family are her supports. MOB denied SI, HI and DV when CSW assessed for safety.   MOB reported, there are no barriers to follow up infant's care. MOB reported, she has all essentials needed to care for infant. MOB reported, infant has a car seat, bassinet, and pack & play. MOB denied any additional barriers.     CSW provided education on sudden infant death syndrome (SIDS).   CSW identifies no further need for intervention or barriers to discharge at this time.  Darcus Austin, MSW, LCSW-A Clinical Social Worker [phone]

## 2020-06-29 NOTE — Progress Notes (Signed)
Post Partum Day 1 Subjective: Jaime Payne is doing well this morning without complaints. Reports good pain control. She is ambulating without lightheadedness and dizziness. She is voiding and tolerating PO. She is afebrile. No chest pain, shortness of breath.   Objective: Patient Vitals for the past 24 hrs:  BP Temp Temp src Pulse Resp SpO2  06/29/20 0530 100/66 98.4 F (36.9 C) Oral 98 18 99 %  06/29/20 0127 (!) 127/97 98.3 F (36.8 C) Oral (!) 124 18 100 %  06/29/20 0033 118/72 99.2 F (37.3 C) Oral (!) 122 18 98 %  06/29/20 0016 113/70 -- -- (!) 114 -- --  06/29/20 0014 -- (!) 100.6 F (38.1 C) Axillary -- -- --  06/29/20 0001 121/82 -- -- (!) 119 -- --  06/28/20 2346 115/69 -- -- (!) 116 -- --  06/28/20 2330 118/69 -- -- (!) 113 -- --  06/28/20 2316 114/75 -- -- (!) 115 -- --  06/28/20 2301 117/70 -- -- (!) 111 -- --  06/28/20 2300 -- (!) 100.9 F (38.3 C) Axillary -- -- --  06/28/20 2246 127/82 -- -- (!) 114 -- --  06/28/20 2230 120/78 -- -- (!) 119 -- --  06/28/20 2224 110/71 -- -- (!) 112 -- --  06/28/20 2201 (!) 150/77 -- -- (!) 128 -- --  06/28/20 2147 (!) 130/52 (!) 100.4 F (38 C) Axillary (!) 116 -- --  06/28/20 2130 124/68 -- -- (!) 129 -- --  06/28/20 2115 122/72 -- -- (!) 132 -- --  06/28/20 2053 -- (!) 100.8 F (38.2 C) Axillary -- -- --  06/28/20 2001 120/67 -- -- (!) 115 -- --  06/28/20 1930 (!) 147/96 -- -- 97 -- --  06/28/20 1903 -- 98.8 F (37.1 C) Oral -- 16 --  06/28/20 1901 134/85 -- -- 91 -- --  06/28/20 1830 (!) 144/89 -- -- (!) 104 -- --  06/28/20 1800 (!) 132/91 -- -- (!) 111 -- --  06/28/20 1747 -- 98.9 F (37.2 C) Oral -- -- --  06/28/20 1730 128/85 -- -- 94 -- --  06/28/20 1701 128/61 -- -- 97 -- --  06/28/20 1631 117/64 -- -- 90 -- --  06/28/20 1600 115/79 -- -- 93 -- --  06/28/20 1531 110/80 -- -- 87 -- --  06/28/20 1500 118/74 -- -- (!) 114 -- --  06/28/20 1430 123/78 -- -- 97 -- --  06/28/20 1425 -- 98.1 F (36.7 C) Axillary -- -- --   06/28/20 1400 117/73 -- -- 91 -- --  06/28/20 1331 120/74 -- -- 90 -- --  06/28/20 1301 140/76 -- -- 91 -- --  06/28/20 1231 115/68 -- -- 95 -- --  06/28/20 1201 113/74 -- -- 93 -- --  06/28/20 1139 113/78 -- -- (!) 109 -- --  06/28/20 1131 132/82 -- -- 94 -- --  06/28/20 1130 -- -- -- -- -- 98 %  06/28/20 1126 140/69 -- -- (!) 259 -- --  06/28/20 1121 125/71 99 F (37.2 C) Axillary (!) 105 -- --  06/28/20 1116 137/72 -- -- (!) 101 -- --  06/28/20 1115 -- -- -- -- -- 97 %  06/28/20 1111 130/80 -- -- (!) 112 -- --  06/28/20 1102 -- -- -- -- -- 98 %  06/28/20 1101 125/72 -- -- (!) 122 -- --    Physical Exam:  General: alert, cooperative and no distress Lochia: appropriate Uterine Fundus: firm DVT Evaluation: No evidence of DVT seen on physical exam.  Recent Labs    06/28/20 0833 06/29/20 0441  WBC 11.4* 12.9*  HGB 11.2* 8.5*  HCT 33.5* 25.8*  PLT 238 211    Recent Labs    06/28/20 0833  NA 134*  K 4.2  CL 107  BUN 8  CREATININE 0.67  GLUCOSE 92  BILITOT 0.4  ALT 15  AST 24  ALKPHOS 217*  PROT 6.0*  ALBUMIN 2.6*    Recent Labs    06/28/20 0833  CALCIUM 9.1    No results for input(s): PROTIME, APTT, INR in the last 72 hours.  No results for input(s): PROTIME, APTT, INR, FIBRINOGEN in the last 72 hours. Assessment/Plan: Jaime Payne 25 y.o. G2P1011 PPD#1 sp SVD 1. PPC: continue routine postpartum care 2. Anemia: delivery EBL 200cc plus additional 200cc with subsequent fundal checks, requiring misoprostol and TXA admin. Bleeding is significantly improved now. Hgb 8.5 (from 11.2), patient pale appearing but reports she is asymptomatic. Start PO iron.  3. Fever: noted just prior to delivery and after delivery, was given 1 dose each of ampicillin and gentamicin, afebrile now, WBC 12.9 4. GHTN: met criteria during stay, admission labs WNL, currently normotensive 5. Rubella non immune: MMR 6. Rh pos, s/p tdap/flu/covid vaccines 7. Dispo: anticipate  discharge home PPD2   LOS: 1 day   Rowland Lathe 06/29/2020, 9:28 AM

## 2020-06-30 MED ORDER — MEASLES, MUMPS & RUBELLA VAC IJ SOLR
0.5000 mL | Freq: Once | INTRAMUSCULAR | Status: AC
  Administered 2020-06-30: 0.5 mL via SUBCUTANEOUS
  Filled 2020-06-30: qty 0.5

## 2020-06-30 MED ORDER — ACETAMINOPHEN 500 MG PO TABS: 500.0000 mg | ORAL_TABLET | Freq: Four times a day (QID) | ORAL | Status: DC | PRN

## 2020-06-30 MED ORDER — IBUPROFEN 200 MG PO TABS: 600.0000 mg | ORAL_TABLET | Freq: Four times a day (QID) | ORAL | Status: DC | PRN

## 2020-06-30 NOTE — Lactation Note (Signed)
This note was copied from a baby's chart. Lactation Consultation Note  Patient Name: [name] VHOYW'V Date: 06/30/2020 Reason for consult: Follow-up assessment;Term;Primapara;1st time breastfeeding Age:23 hours  P1 mother whose infant is now 44 hours old.  This is a term baby at 40+0 weeks.    Mother requested assistance.    Mother is exhausted stating she has only slept 30 minutes tonight.  Baby is hungry and mother now desires to supplement with formula; no donor breast milk available.  Discussed options for feeding including formula in the NS or cup feeding.  Mother desires to use a bottle and nipple.  Demonstrated paced bottle feeding and baby consumed 17 mls using the extra slow flow nipple.  Burped well, swaddled and placed in bassinet.  Reviewed supplementation guidelines with mother.  Suggested she awaken father to comfort baby as needed so she can rest now.  Asked RN to complete morning report outside of her room.  RN will provide morning medication and allow mother time to rest.  Mother appreciative.   Maternal Data    Feeding Mother's Current Feeding Choice: Breast Milk and Formula Nipple Type: Extra Slow Flow  LATCH Score                    Lactation Tools Discussed/Used    Interventions    Discharge Discharge Education: Engorgement and breast care  Consult Status Consult Status: Complete Date: 06/30/20 Follow-up type: In-patient    Karilyn Wind R Mandell Pangborn 06/30/2020, 6:21 AM

## 2020-06-30 NOTE — Discharge Instructions (Signed)

## 2020-06-30 NOTE — Progress Notes (Signed)
Patient scored a 12 on her [place] postnatal depression scale. Clinical social worker has already seen patient. Called Darcus Austin, CSW to notify her of score; however, Arita Miss states that she has already discussed essential education with patient and the patient has no need for further intervention. Maxwell Caul, Leretha Dykes Wallace

## 2020-06-30 NOTE — Discharge Summary (Signed)
Postpartum Discharge Summary     Patient Name: Jaime Payne DOB: 1995-04-26 MRN: [id number]  Date of admission: 06/28/2020 Delivery date:06/28/2020  Delivering provider: Irene Pap E  Date of discharge: 06/30/2020  Admitting diagnosis: [redacted] weeks gestation of pregnancy [Z3A.40] Intrauterine pregnancy: [redacted]w[redacted]d    Secondary diagnosis:  Active Problems:   [redacted] weeks gestation of pregnancy  Additional problems: none    Discharge diagnosis: Term Pregnancy Delivered, Gestational Hypertension and Anemia                                              Post partum procedures:none Augmentation: Pitocin Complications: None  Hospital course: Onset of Labor With Vaginal Delivery      25y.o. yo G2P1011 at 423w0das admitted in Latent Labor on 06/28/2020 with mild range blood pressure elevations. She met criteria for gestational hypertension during admission. Labor was augmented with pitocin. Patient had an uncomplicated labor course as follows:  Membrane Rupture Time/Date: 2:02 PM ,06/28/2020   Delivery Method:Vaginal, Spontaneous  Episiotomy: None  Lacerations:  2nd degree;Perineal  She was treated with ampicillin and gentamicin immediately after delivery due to a fever. Patient had an otherwise uncomplicated postpartum course.  She is ambulating, tolerating a regular diet, passing flatus, and urinating well. Blood pressures have been normotensive since delivery. Patient is discharged home in stable condition on 06/30/20.  Newborn Data: Birth date:06/28/2020  Birth time:9:01 PM  Gender:Female  Living status:Living  Apgars:8 ,9  Weight:4111 g   Magnesium Sulfate received: No BMZ received: No Rhophylac:N/A MMR: planned prior to discharge T-DaP:Given prenatally Flu: No Transfusion:No  Physical exam  Vitals:   06/29/20 1819 06/29/20 1900 06/29/20 2058 06/30/20 0535  BP: 117/72  116/69 116/66  Pulse: 93  89 96  Resp: _0 Temp: 97.8 F (36.6 C)  97.9 F (36.6 C) 97.7 F  (36.5 C)  TempSrc:   Oral Oral  SpO2: 99% 98%  96%  Weight:      Height:       General: alert, cooperative and no distress Lochia: appropriate Uterine Fundus: firm Incision: N/A DVT Evaluation: No evidence of DVT seen on physical exam. Labs: Lab Results  Component Value Date   WBC 12.9 (H) 06/29/2020   HGB 8.5 (L) 06/29/2020   HCT 25.8 (L) 06/29/2020   MCV 90.2 06/29/2020   PLT 211 06/29/2020   CMP Latest Ref Rng & Units 06/28/2020  Glucose 70 - 99 mg/dL 92  BUN 6 - 20 mg/dL 8  Creatinine 0.44 - 1.00 mg/dL 0.67  Sodium 135 - 145 mmol/L 134(L)  Potassium 3.5 - 5.1 mmol/L 4.2  Chloride 98 - 111 mmol/L 107  CO2 22 - 32 mmol/L 18(L)  Calcium 8.9 - 10.3 mg/dL 9.1  Total Protein 6.5 - 8.1 g/dL 6.0(L)  Total Bilirubin 0.3 - 1.2 mg/dL 0.4  Alkaline Phos 38 - 126 U/L 217(H)  AST 15 - 41 U/L 24  ALT 0 - 44 U/L 15   Edinburgh Score: No flowsheet data found.    After visit meds:  Allergies as of 06/30/2020   No Known Allergies     Medication List    STOP taking these medications   cyclobenzaprine 10 MG tablet Commonly known as: FLEXERIL     TAKE these medications   acetaminophen 500 MG tablet Commonly known as: TYLENOL Take 1  tablet (500 mg total) by mouth every 6 (six) hours as needed (for pain scale < 4).   budesonide 180 MCG/ACT inhaler Commonly known as: PULMICORT Inhale into the lungs 2 (two) times daily.   ferrous sulfate 325 (65 FE) MG tablet Take 325 mg by mouth daily with breakfast.   ibuprofen 200 MG tablet Commonly known as: ADVIL Take 3 tablets (600 mg total) by mouth every 6 (six) hours as needed for cramping.   prenatal multivitamin Tabs tablet Take 1 tablet by mouth daily at 12 noon.        Discharge home in stable condition Infant Feeding: Breast Infant Disposition:home with mother Discharge instruction: per After Visit Summary and Postpartum booklet. Activity: Advance as tolerated. Pelvic rest for 6 weeks.  Diet: routine  diet Anticipated Birth Control: Unsure Postpartum Appointment:4 weeks Additional Postpartum F/U: none Future Appointments:No future appointments. Follow up Visit:  Follow-up Information    Marinone, Michelle E, MD. Schedule an appointment as soon as possible for a visit in 4 week(s).   Specialty: Obstetrics and Gynecology Contact information: [address] [place] [address] [phone]                   06/30/2020 Michelle E Marinone, MD  

## 2020-06-30 NOTE — Lactation Note (Signed)
This note was copied from a baby's chart. Lactation Consultation Note  Patient Name: Jaime Payne EQAST'M Date: 06/30/2020 Reason for consult: Follow-up assessment Age:23 hours   P1 mother whose infant is now 36 hours old.  This is a term baby at 40+0 weeks.  RN requested latch assistance.  Baby was in the bassinet when I arrived.  Mother stated she had just hand expressed 4 mls of colostrum and spoon fed baby.  Offered to assist with latching, however, mother desires to wait.  She will call me when she is ready for assistance.  Observed baby with a pacifier in her mouth and educated mother about the disadvantages of using a pacifier.  RN updated.   Maternal Data    Feeding    LATCH Score                    Lactation Tools Discussed/Used Tools: Nipple Shields Nipple shield size: 20  Interventions    Discharge    Consult Status Consult Status: Follow-up Date: 06/30/20 Follow-up type: In-patient    Jaime Payne 06/30/2020, 5:03 AM

## 2020-06-30 NOTE — Lactation Note (Addendum)
This note was copied from a baby's chart. Lactation Consultation Note  Patient Name: Jaime Payne ESPQZ'R Date: 06/30/2020 Reason for consult: Follow-up assessment;1st time breastfeeding;Primapara;Term Age:23 hours   LC Follow Up Visit:  Mother called for assistance.  NT in room for weight check.  Mother able to demonstrate hand expression and colostrum drops fed to baby.  Attempted to latch in the football hold on the right breast, however, baby fussy and pushed back off the breast.  Repeated attempts tried and then changed to the cross cradle hold with the same result.  Mother willing to try the NS at her bedside.  Placed the #20 NS and latched baby.  Baby took a few sucks and pushed back again.  Mother requires a #24 NS.  Mother cradled baby and she fell asleep.  Swaddled baby and placed in bassinet.  Mother is exhausted and desires rest.  Suggested formula supplementation due to baby being hungry.  She does not wish to use any formula at this time.  I offered to return as needed for assistance.  Suggested mother continue to pump every three hours for breast stimulation.  Mother will continue to pump after getting some rest.  Father asleep on the couch.    Maternal Data    Feeding Mother's Current Feeding Choice: Breast Milk  LATCH Score                    Lactation Tools Discussed/Used Tools: Nipple Shields Nipple shield size: 20  Interventions    Discharge    Consult Status Consult Status: Follow-up Date: 06/30/20 Follow-up type: In-patient    Little Ishikawa 06/30/2020, 5:34 AM

## 2020-06-30 NOTE — Progress Notes (Signed)
Post Partum Day 2 Subjective: Jaime Payne is doing well this morning without complaints. Reports good pain control. She is ambulating without lightheadedness and dizziness. She is voiding and tolerating PO. She is afebrile. No chest pain, shortness of breath.    Objective: Patient Vitals for the past 24 hrs:  BP Temp Temp src Pulse Resp SpO2  06/30/20 0535 116/66 97.7 F (36.5 C) Oral 96 18 96 %  06/29/20 2058 116/69 97.9 F (36.6 C) Oral 89 18 --  06/29/20 1900 -- -- -- -- -- 98 %  06/29/20 1819 117/72 97.8 F (36.6 C) -- 93 18 99 %  06/29/20 1245 123/87 98.6 F (37 C) Oral 92 18 99 %    Physical Exam:  General: alert, cooperative and no distress Lochia: appropriate Uterine Fundus: firm DVT Evaluation: No evidence of DVT seen on physical exam.  Recent Labs    06/28/20 0833 06/29/20 0441  WBC 11.4* 12.9*  HGB 11.2* 8.5*  HCT 33.5* 25.8*  PLT 238 211    Recent Labs    06/28/20 0833  NA 134*  K 4.2  CL 107  BUN 8  CREATININE 0.67  GLUCOSE 92  BILITOT 0.4  ALT 15  AST 24  ALKPHOS 217*  PROT 6.0*  ALBUMIN 2.6*    Recent Labs    06/28/20 0833  CALCIUM 9.1    No results for input(s): PROTIME, APTT, INR in the last 72 hours.  No results for input(s): PROTIME, APTT, INR, FIBRINOGEN in the last 72 hours. Assessment/Plan: Jaime Payne 25 y.o. G2P1011 PPD#2 sp SVD 1. PPC: continue routine postpartum care 2. Anemia: continue PO iron.  3. Fever: noted just prior to delivery and after delivery, was given 1 dose each of ampicillin and gentamicin, has remained afebrile since 4. GHTN: met criteria during stay, admission labs WNL, has been normotensive since delivery 5. Rubella non immune: MMR prior to discharge 6. Rh pos, s/p tdap/flu/covid vaccines 7. Dispo: discharge home, discharge instructions reviewed   LOS: 2 days   Rowland Lathe 06/30/2020, 9:43 AM

## 2020-07-01 ENCOUNTER — Telehealth: Payer: Self-pay

## 2020-07-01 NOTE — Telephone Encounter (Signed)
Transition Care Management Unsuccessful Follow-up Telephone Call  Date of discharge and from where:  06/30/2020 from [facility]  Attempts:  1st Attempt  Reason for unsuccessful TCM follow-up call:  Left voice message

## 2020-07-02 NOTE — Telephone Encounter (Signed)
Transition Care Management Unsuccessful Follow-up Telephone Call  Date of discharge and from where:  06/30/2020 from [facility]  Attempts:  2nd Attempt  Reason for unsuccessful TCM follow-up call:  Left voice message

## 2020-07-03 NOTE — Telephone Encounter (Signed)
Transition Care Management Unsuccessful Follow-up Telephone Call  Date of discharge and from where:  06/30/20 from [facility]  Attempts:  3rd Attempt  Reason for unsuccessful TCM follow-up call:  Unable to reach patient

## 2020-07-05 ENCOUNTER — Inpatient Hospital Stay (HOSPITAL_COMMUNITY)
Admission: AD | Admit: 2020-07-05 | Payer: Medicaid Other | Source: Home / Self Care | Admitting: Obstetrics & Gynecology

## 2020-07-05 ENCOUNTER — Inpatient Hospital Stay (HOSPITAL_COMMUNITY): Payer: Medicaid Other

## 2020-08-27 DIAGNOSIS — Z30430 Encounter for insertion of intrauterine contraceptive device: Secondary | ICD-10-CM | POA: Diagnosis not present

## 2020-08-27 DIAGNOSIS — Z113 Encounter for screening for infections with a predominantly sexual mode of transmission: Secondary | ICD-10-CM | POA: Diagnosis not present

## 2020-08-27 DIAGNOSIS — Z3202 Encounter for pregnancy test, result negative: Secondary | ICD-10-CM | POA: Diagnosis not present

## 2020-09-24 DIAGNOSIS — Z30432 Encounter for removal of intrauterine contraceptive device: Secondary | ICD-10-CM | POA: Diagnosis not present

## 2020-09-24 DIAGNOSIS — F53 Postpartum depression: Secondary | ICD-10-CM | POA: Diagnosis not present

## 2020-10-02 DIAGNOSIS — Z3042 Encounter for surveillance of injectable contraceptive: Secondary | ICD-10-CM | POA: Diagnosis not present

## 2020-10-02 DIAGNOSIS — Z3202 Encounter for pregnancy test, result negative: Secondary | ICD-10-CM | POA: Diagnosis not present

## 2020-12-03 DIAGNOSIS — L298 Other pruritus: Secondary | ICD-10-CM | POA: Diagnosis not present

## 2020-12-25 DIAGNOSIS — Z3042 Encounter for surveillance of injectable contraceptive: Secondary | ICD-10-CM | POA: Diagnosis not present

## 2021-07-11 IMAGING — CT CT ANGIO CHEST
2 of 6 series · 19 of 36 positions shown · IV contrast (omnipaque)
Comparison: None.

CLINICAL DATA: Cough and shortness of breath

EXAM:
CT ANGIOGRAPHY CHEST WITH CONTRAST
TECHNIQUE: Multidetector CT imaging of the chest was performed using the
standard protocol during bolus administration of intravenous
contrast. Multiplanar CT image reconstructions and MIPs were
obtained to evaluate the vascular anatomy.
CONTRAST:  52 mL OMNIPAQUE IOHEXOL 350 MG/ML SOLN

[Series 7: pe thins · axial · 0.94mm/px · z∈[-124,+97]mm · 18 of 352 slices shown]
[im 18/352  lung]
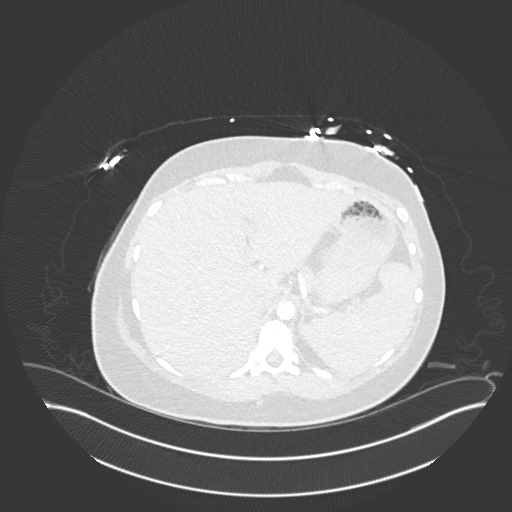
[im 36/352  mediastinal]
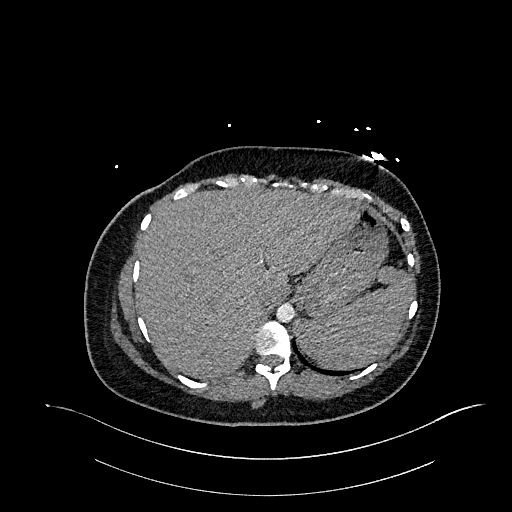
[im 53/352  lung]
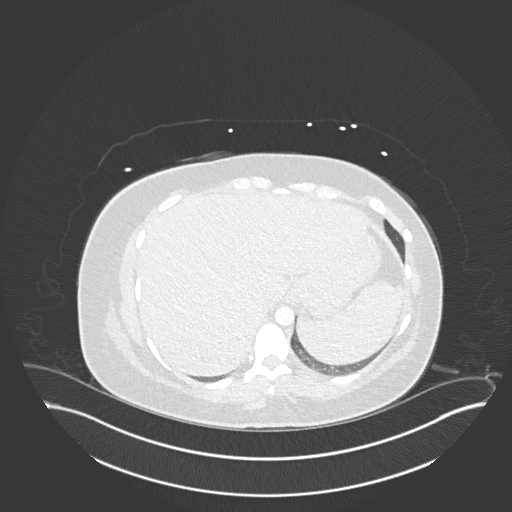
[im 71/352  mediastinal]
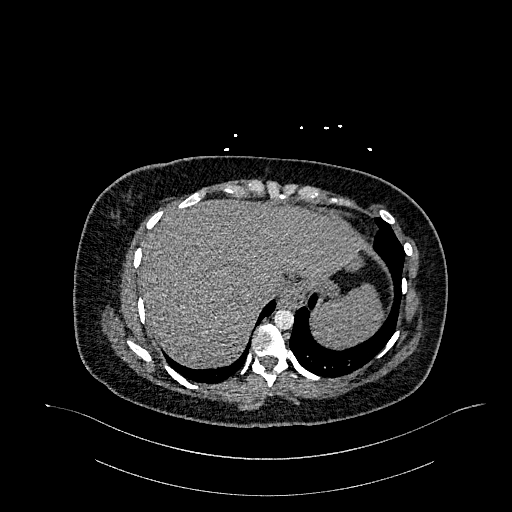
[im 88/352  lung]
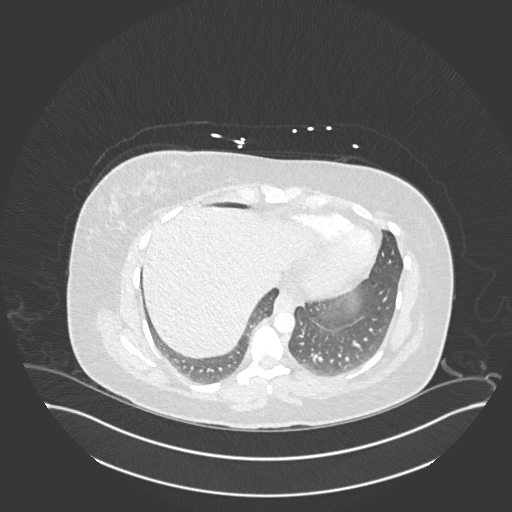
[im 106/352  mediastinal]
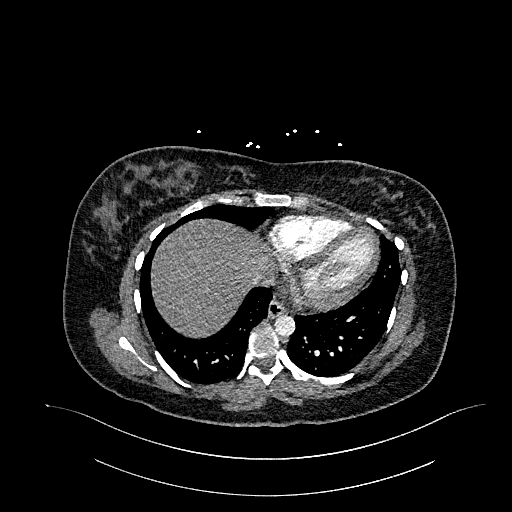
[im 123/352  lung]
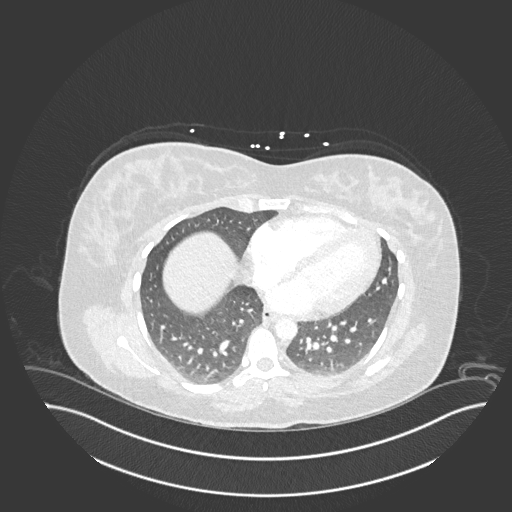
[im 141/352  mediastinal]
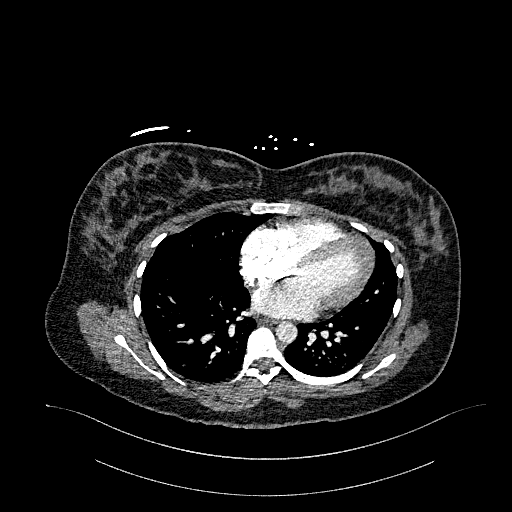
[im 158/352  lung]
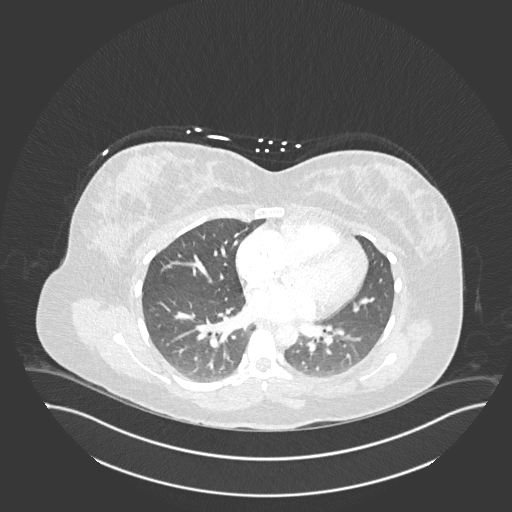
[im 194/352  mediastinal]
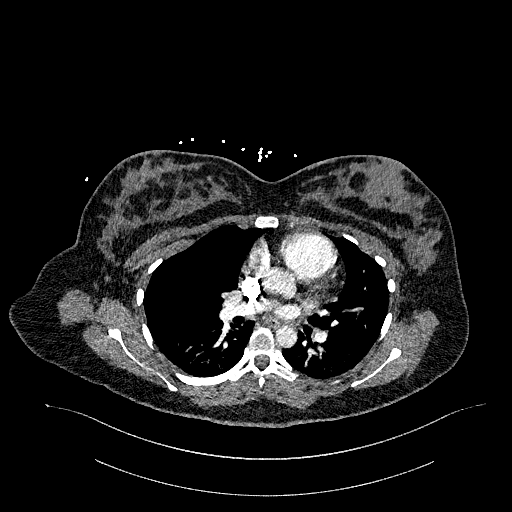
[im 211/352  lung]
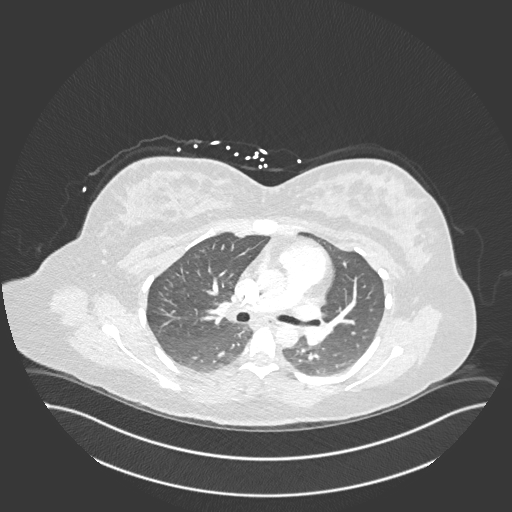
[im 229/352  mediastinal]
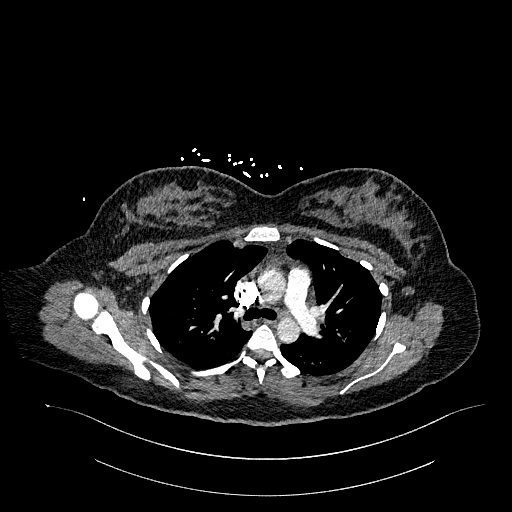
[im 246/352  lung]
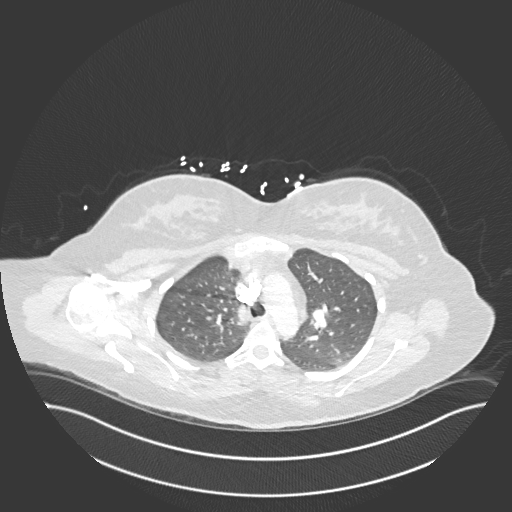
[im 264/352  mediastinal]
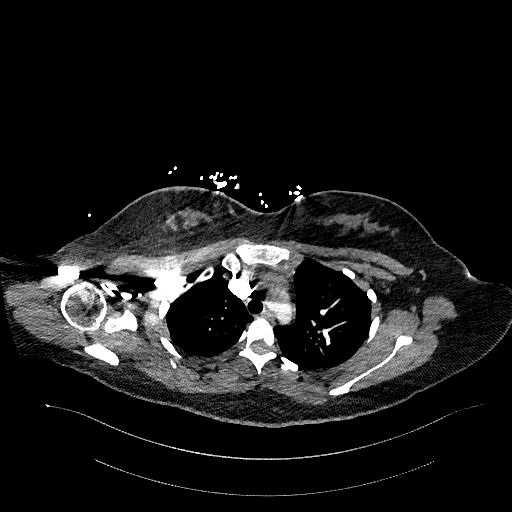
[im 281/352  lung]
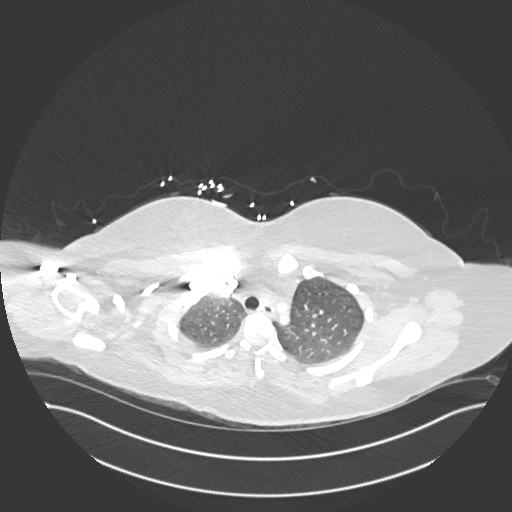
[im 299/352  mediastinal]
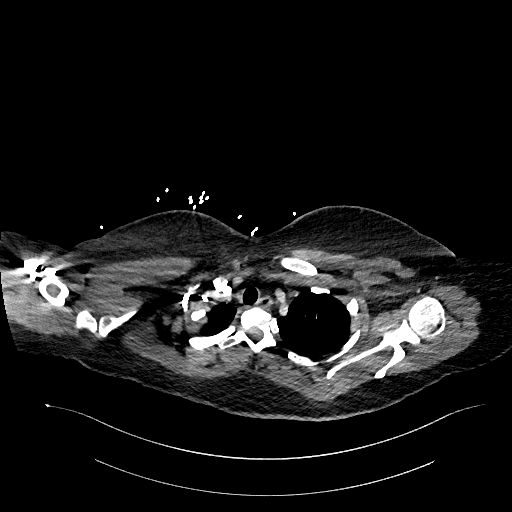
[im 316/352  lung]
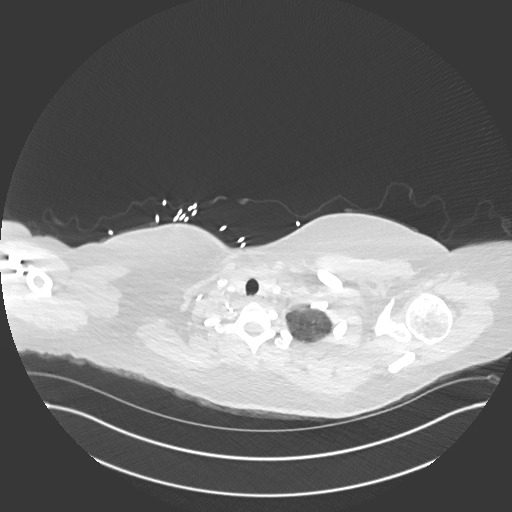
[im 334/352  mediastinal]
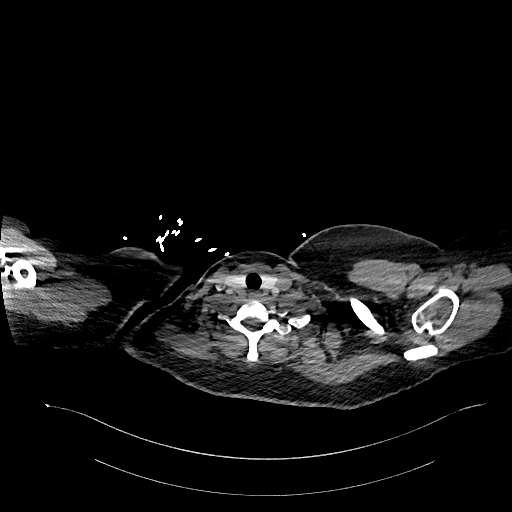

[Series 8: pe 2mm cor · coronal · 0.52mm/px · 1 of 145 slices shown]
[im 73/145  mediastinal]
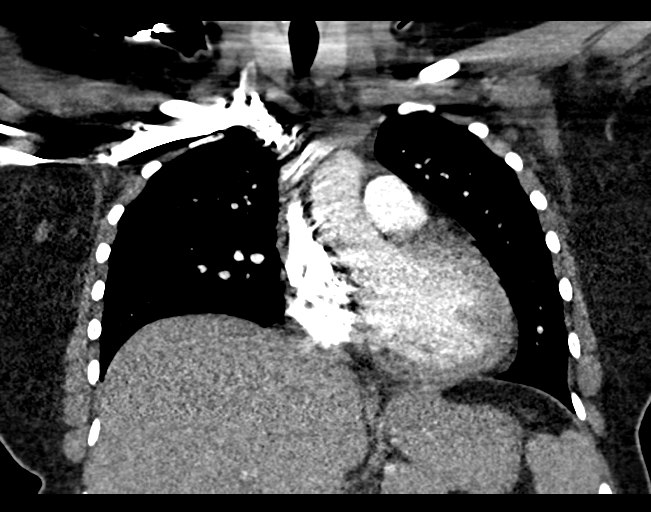

[19 of 36 positions shown; findings below may reference images not displayed]

FINDINGS: Cardiovascular: There is no demonstrable pulmonary embolus. There is
no thoracic aortic aneurysm or dissection. The visualized great
vessels normal. Note that the right innominate and left common
carotid arteries arise as a common trunk, an anatomic variant. No
pericardial effusion or pericardial thickening.

Mediastinum/Nodes: Visualized thyroid appears normal. Thymic tissue
is present, normal for age. There is no appreciable thoracic
adenopathy. There is a small hiatal hernia.

Lungs/Pleura: Clear. No appreciable pleural effusion. No
pneumothorax. Trachea and major bronchial structures appear patent.

Upper Abdomen: Visualized upper abdominal structures appear
unremarkable.

Musculoskeletal: There are no blastic or lytic bone lesions. No
evident chest wall lesions.

Review of the MIP images confirms the above findings.
IMPRESSION: 1. No demonstrable pulmonary embolus. No thoracic aortic aneurysm or
dissection.

2.  Lungs clear.

3.  No evident adenopathy.

## 2022-09-23 LAB — OB RESULTS CONSOLE GC/CHLAMYDIA
Chlamydia: NEGATIVE
Neisseria Gonorrhea: NEGATIVE

## 2022-09-23 LAB — OB RESULTS CONSOLE HIV ANTIBODY (ROUTINE TESTING): HIV: NONREACTIVE

## 2022-09-23 LAB — OB RESULTS CONSOLE RUBELLA ANTIBODY, IGM: Rubella: IMMUNE

## 2022-09-23 LAB — OB RESULTS CONSOLE ANTIBODY SCREEN: Antibody Screen: NEGATIVE

## 2022-09-23 LAB — HEPATITIS C ANTIBODY: HCV Ab: NEGATIVE

## 2022-09-23 LAB — OB RESULTS CONSOLE HEPATITIS B SURFACE ANTIGEN: Hepatitis B Surface Ag: NEGATIVE

## 2022-09-23 LAB — OB RESULTS CONSOLE RPR: RPR: NONREACTIVE

## 2022-12-04 ENCOUNTER — Ambulatory Visit (HOSPITAL_COMMUNITY)
Admission: EM | Admit: 2022-12-04 | Discharge: 2022-12-04 | Disposition: A | Discharge status: Home / Self Care | Payer: Medicaid Other | Attending: Emergency Medicine | Admitting: Emergency Medicine

## 2022-12-04 ENCOUNTER — Ambulatory Visit: Payer: Self-pay

## 2022-12-04 ENCOUNTER — Encounter (HOSPITAL_COMMUNITY): Payer: Self-pay

## 2022-12-04 DIAGNOSIS — J22 Unspecified acute lower respiratory infection: Secondary | ICD-10-CM | POA: Diagnosis not present

## 2022-12-04 DIAGNOSIS — Z3A20 20 weeks gestation of pregnancy: Secondary | ICD-10-CM

## 2022-12-04 DIAGNOSIS — H66001 Acute suppurative otitis media without spontaneous rupture of ear drum, right ear: Secondary | ICD-10-CM

## 2022-12-04 MED ORDER — CEFDINIR 300 MG PO CAPS: 300.0000 mg | ORAL_CAPSULE | Freq: Two times a day (BID) | ORAL | 0 refills | Status: AC

## 2022-12-04 NOTE — Discharge Instructions (Signed)
After consulting with my supervising physician, he agrees that x-ray of your chest is not necessary.  Because you are young, healthy and have no significant comorbidities, the antibiotic that we will be giving you for your ear infection will also treat any infection in your lower respiratory tract.  Please read below to learn more about the medications, dosages and frequencies that I recommend to help alleviate your symptoms and to get you feeling better soon:   Omnicef (cefdinir):  Please take one (1) dose twice daily for 10 days.  This antibiotic can cause upset stomach, this will resolve once antibiotics are complete.  You are welcome to take a probiotic, eat yogurt, take Imodium while taking this medication.  Please avoid other systemic medications such as Maalox, Pepto-Bismol or milk of magnesia as they can interfere with the body's ability to absorb the antibiotics.   Flonase (fluticasone): This is a steroid nasal spray that used once daily, 1 spray in each nare.  This works best when used on a daily basis. This medication does not work well if it is only used when you think you need it.  After 3 to 5 days of use, you will notice significant reduction of the inflammation and mucus production that is currently being caused by exposure to allergens, whether seasonal or environmental.  The most common side effect of this medication is nosebleeds.  If you experience a nosebleed, please discontinue use for 1 week, then feel free to resume.  If you find that your insurance will not pay for this medication, please consider a different nasal steroids such as Nasonex (mometasone), or Nasacort (triamcinolone).   If symptoms have not meaningfully improved in the next 5 to 7 days, please return for repeat evaluation or follow-up with your regular provider.  If symptoms have worsened in the next 3 to 5 days, please return for repeat evaluation or follow-up with your regular provider.    Thank you for visiting  urgent care today.  We appreciate the opportunity to participate in your care.

## 2022-12-04 NOTE — Telephone Encounter (Signed)
Summary: ear pain   Pt called in has right ear pain ,said she dint feel like driving to her primary in [place]        2nd attempt to contact patient [phone] to review sx of right ear pain. No answer, LVMTCB #[id number]. In review of chart patient currently at [facility].

## 2022-12-04 NOTE — Telephone Encounter (Signed)
Summary: ear pain   Pt called in has right ear pain ,said she dint feel like driving to her primary in [facility] pt - Call cannot be completed at this time.

## 2022-12-04 NOTE — ED Triage Notes (Signed)
Right earache onset Monday. Patient having ear fullness but nothing comes out when she tries to clear it. Was sick 3 weeks ago and now having cough and right ear pain, sore throat from the cough.   No known sick exposure or anyone with sick symptoms. Patient currently [redacted] weeks pregnant.

## 2022-12-04 NOTE — ED Provider Notes (Signed)
[facility]    CSN: [id number] Arrival date & time: 12/04/22  1118    HISTORY   Chief Complaint  Patient presents with   Otalgia   HPI Jaime Payne is a pleasant, 27 y.o. female who presents to urgent care today. Patient complains of a 5-day history of right-sided ear ache.  Patient states her ear feels full but when she tries to clean it out with a Q-tip nothing is there.  Patient states she was sick 3 weeks ago, got better but is now having right ear pain, sore throat from frequent, nonproductive cough.  Patient denies fever, body aches, chills, nausea, vomiting, diarrhea, known sick contacts.  The history is provided by the patient.   Past Medical History:  Diagnosis Date   Anxiety    Asthma    Depression    Leukemia (HCC)    [age]   Patient Active Problem List   Diagnosis Date Noted   [redacted] weeks gestation of pregnancy 06/28/2020   Past Surgical History:  Procedure Laterality Date   PORTACATH PLACEMENT     RHINOPLASTY  2019   OB History     Gravida  3   Para  1   Term  1   Preterm      AB  1   Living  1      SAB      IAB  1   Ectopic      Multiple  0   Live Births  1          Home Medications    Prior to Admission medications   Medication Sig Start Date End Date Taking? Authorizing Provider  Prenatal Vit-Fe Fumarate-FA (PRENATAL MULTIVITAMIN) TABS tablet Take 1 tablet by mouth daily at 12 noon.   Yes [provider]  acetaminophen (TYLENOL) 500 MG tablet Take 1 tablet (500 mg total) by mouth every 6 (six) hours as needed (for pain scale < 4). 06/30/20   Charlett Nose, MD  budesonide (PULMICORT) 180 MCG/ACT inhaler Inhale into the lungs 2 (two) times daily.    [provider]  ferrous sulfate 325 (65 FE) MG tablet Take 325 mg by mouth daily with breakfast.    [provider]  ibuprofen (ADVIL) 200 MG tablet Take 3 tablets (600 mg total) by mouth every 6 (six) hours as needed for cramping.  06/30/20   Charlett Nose, MD    Family History Family History  Problem Relation Age of Onset   Anxiety disorder Mother    Diabetes Mother    Hypertension Mother    Diabetes Father    Hypertension Father    Hyperlipidemia Father    Birth defects Maternal Grandfather    Birth defects Paternal Grandfather        liver   Social History Social History   Tobacco Use   Smoking status: Never   Smokeless tobacco: Never  Vaping Use   Vaping status: Never Used  Substance Use Topics   Alcohol use: Never   Drug use: Never   Allergies   Patient has no known allergies.  Review of Systems Review of Systems Pertinent findings revealed after performing a 14 point review of systems has been noted in the history of present illness.  Physical Exam Vital Signs BP 118/80 (BP Location: Left Wrist)   Pulse 76   Temp 98.2 F (36.8 C) (Oral)   Resp 18   Ht 5\' 5"  (1.651 m)   Wt 201 lb (  91.2 kg)   LMP 07/04/2022 (Approximate)   SpO2 97%   Breastfeeding No   BMI 33.45 kg/m   No data found.  Physical Exam Vitals and nursing note reviewed.  Constitutional:      General: She is not in acute distress.    Appearance: Normal appearance. She is not ill-appearing.  HENT:     Head: Normocephalic and atraumatic.     Salivary Glands: Right salivary gland is not diffusely enlarged or tender. Left salivary gland is not diffusely enlarged or tender.     Right Ear: Hearing, ear canal and external ear normal. No drainage. A middle ear effusion is present. There is no impacted cerumen. Tympanic membrane is injected and erythematous. Tympanic membrane is not bulging.     Left Ear: Hearing, tympanic membrane, ear canal and external ear normal. No drainage.  No middle ear effusion. There is no impacted cerumen. Tympanic membrane is not erythematous or bulging.     Nose: Congestion and rhinorrhea present. No nasal deformity, septal deviation or mucosal edema. Rhinorrhea is clear.     Right  Turbinates: Not enlarged, swollen or pale.     Left Turbinates: Not enlarged, swollen or pale.     Right Sinus: No maxillary sinus tenderness or frontal sinus tenderness.     Left Sinus: No maxillary sinus tenderness or frontal sinus tenderness.     Mouth/Throat:     Lips: Pink. No lesions.     Mouth: Mucous membranes are moist. No oral lesions.     Tongue: No lesions.     Palate: No mass and lesions.     Pharynx: Oropharynx is clear. Uvula midline. No posterior oropharyngeal erythema or uvula swelling.     Tonsils: No tonsillar exudate. 0 on the right. 0 on the left.  Eyes:     General: Lids are normal.        Right eye: No discharge.        Left eye: No discharge.     Extraocular Movements: Extraocular movements intact.     Conjunctiva/sclera: Conjunctivae normal.     Right eye: Right conjunctiva is not injected.     Left eye: Left conjunctiva is not injected.  Neck:     Trachea: Trachea and phonation normal.  Cardiovascular:     Rate and Rhythm: Normal rate and regular rhythm.     Pulses: Normal pulses.     Heart sounds: Normal heart sounds. No murmur heard.    No friction rub. No gallop.  Pulmonary:     Effort: Pulmonary effort is normal. No accessory muscle usage, prolonged expiration or respiratory distress.     Breath sounds: No stridor, decreased air movement or transmitted upper airway sounds. Examination of the right-upper field reveals decreased breath sounds. Examination of the right-middle field reveals decreased breath sounds and rales. Examination of the left-middle field reveals decreased breath sounds and rales. Examination of the right-lower field reveals decreased breath sounds and rales. Examination of the left-lower field reveals decreased breath sounds. Decreased breath sounds and rales present. No wheezing or rhonchi.  Chest:     Chest wall: No tenderness.  Musculoskeletal:        General: Normal range of motion.     Cervical back: Full passive range of motion  without pain, normal range of motion and neck supple. Normal range of motion.  Lymphadenopathy:     Cervical: Cervical adenopathy present.     Right cervical: Superficial cervical adenopathy present.     Left  cervical: Superficial cervical adenopathy present.  Skin:    General: Skin is warm and dry.     Findings: No erythema or rash.  Neurological:     General: No focal deficit present.     Mental Status: She is alert and oriented to person, place, and time.  Psychiatric:        Mood and Affect: Mood normal.        Behavior: Behavior normal.     Visual Acuity Right Eye Distance:   Left Eye Distance:   Bilateral Distance:    Right Eye Near:   Left Eye Near:    Bilateral Near:     UC Couse / Diagnostics / Procedures:     Radiology No results found.  Procedures Procedures (including critical care time) EKG  Pending results:  Labs Reviewed - No data to display  Medications Ordered in UC: Medications - No data to display  UC Diagnoses / Final Clinical Impressions(s)   I have reviewed the triage vital signs and the nursing notes.  Pertinent labs & imaging results that were available during my care of the patient were reviewed by me and considered in my medical decision making (see chart for details).    Final diagnoses:  Acute suppurative otitis media of right ear without spontaneous rupture of tympanic membrane, recurrence not specified  Acute lower respiratory tract infection  [redacted] weeks gestation of pregnancy   Patient discussed with Dr. Demaris Callander.  Agree with plan to treat patient empirically for presumed lower respiratory tract infection with cefdinir which will also be used to treat patient for acute suppurative right otitis media.  Patient provided with a prescription for Flonase to help with postnasal drip and nasal congestion.  Conservative care recommended.  Return precautions advised.  Please see discharge instructions below for details of plan of care as  provided to patient. ED Prescriptions     Medication Sig Dispense Auth. Provider   cefdinir (OMNICEF) 300 MG capsule Take 1 capsule (300 mg total) by mouth 2 (two) times daily for 10 days. 20 capsule Theadora Rama Scales, PA-C      PDMP not reviewed this encounter.  Pending results:  Labs Reviewed - No data to display  Discharge Instructions:   Discharge Instructions      After consulting with my supervising physician, he agrees that x-ray of your chest is not necessary.  Because you are young, healthy and have no significant comorbidities, the antibiotic that we will be giving you for your ear infection will also treat any infection in your lower respiratory tract.  Please read below to learn more about the medications, dosages and frequencies that I recommend to help alleviate your symptoms and to get you feeling better soon:   Omnicef (cefdinir):  Please take one (1) dose twice daily for 10 days.  This antibiotic can cause upset stomach, this will resolve once antibiotics are complete.  You are welcome to take a probiotic, eat yogurt, take Imodium while taking this medication.  Please avoid other systemic medications such as Maalox, Pepto-Bismol or milk of magnesia as they can interfere with the body's ability to absorb the antibiotics.   Flonase (fluticasone): This is a steroid nasal spray that used once daily, 1 spray in each nare.  This works best when used on a daily basis. This medication does not work well if it is only used when you think you need it.  After 3 to 5 days of use, you will notice significant reduction  of the inflammation and mucus production that is currently being caused by exposure to allergens, whether seasonal or environmental.  The most common side effect of this medication is nosebleeds.  If you experience a nosebleed, please discontinue use for 1 week, then feel free to resume.  If you find that your insurance will not pay for this medication, please consider  a different nasal steroids such as Nasonex (mometasone), or Nasacort (triamcinolone).   If symptoms have not meaningfully improved in the next 5 to 7 days, please return for repeat evaluation or follow-up with your regular provider.  If symptoms have worsened in the next 3 to 5 days, please return for repeat evaluation or follow-up with your regular provider.    Thank you for visiting urgent care today.  We appreciate the opportunity to participate in your care.       Disposition Upon Discharge:  Condition: stable for discharge home  Patient presented with an acute illness with associated systemic symptoms and significant discomfort requiring urgent management. In my opinion, this is a condition that a prudent lay person (someone who possesses an average knowledge of health and medicine) may potentially expect to result in complications if not addressed urgently such as respiratory distress, impairment of bodily function or dysfunction of bodily organs.   Routine symptom specific, illness specific and/or disease specific instructions were discussed with the patient and/or caregiver at length.   As such, the patient has been evaluated and assessed, work-up was performed and treatment was provided in alignment with urgent care protocols and evidence based medicine.  Patient/parent/caregiver has been advised that the patient may require follow up for further testing and treatment if the symptoms continue in spite of treatment, as clinically indicated and appropriate.  Patient/parent/caregiver has been advised to return to the [facility] or PCP if no better; to PCP or the Emergency Department if new signs and symptoms develop, or if the current signs or symptoms continue to change or worsen for further workup, evaluation and treatment as clinically indicated and appropriate  The patient will follow up with their current PCP if and as advised. If the patient does not currently have a PCP we will assist  them in obtaining one.   The patient may need specialty follow up if the symptoms continue, in spite of conservative treatment and management, for further workup, evaluation, consultation and treatment as clinically indicated and appropriate.  Patient/parent/caregiver verbalized understanding and agreement of plan as discussed.  All questions were addressed during visit.  Please see discharge instructions below for further details of plan.  This office note has been dictated using Teaching laboratory technician.  Unfortunately, this method of dictation can sometimes lead to typographical or grammatical errors.  I apologize for your inconvenience in advance if this occurs.  Please do not hesitate to reach out to me if clarification is needed.      Theadora Rama Scales, PA-C 12/04/22 1218

## 2022-12-04 NOTE — Telephone Encounter (Signed)
Summary: ear pain   Pt called in has right ear pain ,said she dint feel like driving to her primary in [facility] pt - left message on machine to return our call. Unable to contact pt after 3 attempts. No contact. Will close out encounter.

## 2022-12-23 ENCOUNTER — Encounter (HOSPITAL_COMMUNITY): Payer: Self-pay

## 2022-12-23 ENCOUNTER — Ambulatory Visit (HOSPITAL_COMMUNITY)
Admission: EM | Admit: 2022-12-23 | Discharge: 2022-12-23 | Disposition: A | Discharge status: Home / Self Care | Payer: Medicaid Other | Attending: Emergency Medicine | Admitting: Emergency Medicine

## 2022-12-23 DIAGNOSIS — H938X1 Other specified disorders of right ear: Secondary | ICD-10-CM | POA: Diagnosis not present

## 2022-12-23 NOTE — ED Triage Notes (Signed)
Pt is [redacted] weeks pregnant. Ear pain; Pt was seen on 12/04/2022 for right ear pain. Pt states ear is not better.

## 2022-12-23 NOTE — ED Provider Notes (Signed)
[facility]    CSN: [id number] Arrival date & time: 12/23/22  0865      History   Chief Complaint Chief Complaint  Patient presents with   ear pain     HPI Jaime Payne is a 27 y.o. female.   Patient presents to clinic complaining of right ear fullness and diminished hearing that started on 11/10, approximately 10 days ago.  She was recently treated with cefdinir for right otitis media, she completed all 10 days of the antibiotics.  She denies any fever, drainage or current pain.  Will have intermittent sharp stabbing pains from the ear.  She is [redacted] weeks pregnant.   The history is provided by the patient and medical records.    Past Medical History:  Diagnosis Date   Anxiety    Asthma    Depression    Leukemia (HCC)    [age]    Patient Active Problem List   Diagnosis Date Noted   [redacted] weeks gestation of pregnancy 06/28/2020    Past Surgical History:  Procedure Laterality Date   PORTACATH PLACEMENT     RHINOPLASTY  2019    OB History     Gravida  3   Para  1   Term  1   Preterm      AB  1   Living  1      SAB      IAB  1   Ectopic      Multiple  0   Live Births  1            Home Medications    Prior to Admission medications   Medication Sig Start Date End Date Taking? Authorizing Provider  Prenatal Vit-Fe Fumarate-FA (PRENATAL MULTIVITAMIN) TABS tablet Take 1 tablet by mouth daily at 12 noon.   Yes [provider]    Family History Family History  Problem Relation Age of Onset   Anxiety disorder Mother    Diabetes Mother    Hypertension Mother    Diabetes Father    Hypertension Father    Hyperlipidemia Father    Birth defects Maternal Grandfather    Birth defects Paternal Grandfather        liver    Social History Social History   Tobacco Use   Smoking status: Never   Smokeless tobacco: Never  Vaping Use   Vaping status: Never Used  Substance Use Topics   Alcohol use: Never   Drug use:  Never     Allergies   Patient has no known allergies.   Review of Systems Review of Systems  Per HPI  Physical Exam Triage Vital Signs ED Triage Vitals  Encounter Vitals Group     BP 12/23/22 0943 104/68     Systolic BP Percentile --      Diastolic BP Percentile --      Pulse Rate 12/23/22 0943 93     Resp 12/23/22 0943 16     Temp 12/23/22 0943 98.7 F (37.1 C)     Temp Source 12/23/22 0943 Oral     SpO2 12/23/22 0943 95 %     Weight --      Height --      Head Circumference --      Peak Flow --      Pain Score 12/23/22 0946 4     Pain Loc --      Pain Education --      Exclude from [employer]  Chart --    No data found.  Updated Vital Signs BP 104/68 (BP Location: Left Arm)   Pulse 93   Temp 98.7 F (37.1 C) (Oral)   Resp 16   LMP 07/04/2022 (Approximate)   SpO2 95%   Visual Acuity Right Eye Distance:   Left Eye Distance:   Bilateral Distance:    Right Eye Near:   Left Eye Near:    Bilateral Near:     Physical Exam Vitals and nursing note reviewed.  Constitutional:      Appearance: Normal appearance.  HENT:     Head: Normocephalic and atraumatic.     Right Ear: There is impacted cerumen.     Left Ear: Tympanic membrane, ear canal and external ear normal.     Nose: Nose normal.     Mouth/Throat:     Mouth: Mucous membranes are moist.  Eyes:     Conjunctiva/sclera: Conjunctivae normal.  Cardiovascular:     Rate and Rhythm: Normal rate.  Pulmonary:     Effort: Pulmonary effort is normal.  Musculoskeletal:        General: Normal range of motion.  Skin:    General: Skin is warm and dry.  Neurological:     General: No focal deficit present.     Mental Status: She is alert.  Psychiatric:        Mood and Affect: Mood normal.      UC Treatments / Results  Labs (all labs ordered are listed, but only abnormal results are displayed) Labs Reviewed - No data to display  EKG   Radiology No results found.  Procedures Procedures (including  critical care time)  Medications Ordered in UC Medications - No data to display  Initial Impression / Assessment and Plan / UC Course  I have reviewed the triage vital signs and the nursing notes.  Pertinent labs & imaging results that were available during my care of the patient were reviewed by me and considered in my medical decision making (see chart for details).  Vitals and triage reviewed, patient is hemodynamically stable.  Small amount of wax obscuring tympanic membrane on right ear, staff irrigated.  Tympanic membrane able to be visualized after irrigation, no erythema or bulging.  No obvious infection.  Could be lingering ear fullness from recent ear infection, symptomatic management discussed.  Plan of care, follow-up care return precautions given, work note provided.  No questions at this time.     Final Clinical Impressions(s) / UC Diagnoses   Final diagnoses:  Ear fullness, right     Discharge Instructions      Today we irrigated your ear.  There is no remaining wax.  You can use a cotton ball to try up any leftover water.  It does not appear to be infected at this time.  Sometimes the fullness and pressure is leftover from congestion, you can take 1200 mg of Mucinex daily to help break up any congestion.  Please follow-up with your primary care provider for any continued issues.  Return to clinic for any new or concerning problems.     ED Prescriptions   None    PDMP not reviewed this encounter.   Charl Wellen, [place], [place] 12/23/22 1028

## 2022-12-23 NOTE — Discharge Instructions (Addendum)
Today we irrigated your ear.  There is no remaining wax.  You can use a cotton ball to try up any leftover water.  It does not appear to be infected at this time.  Sometimes the fullness and pressure is leftover from congestion, you can take 1200 mg of Mucinex daily to help break up any congestion.  Please follow-up with your primary care provider for any continued issues.  Return to clinic for any new or concerning problems.

## 2023-01-11 ENCOUNTER — Encounter (HOSPITAL_COMMUNITY): Payer: Self-pay | Admitting: Obstetrics and Gynecology

## 2023-01-11 ENCOUNTER — Other Ambulatory Visit: Payer: Self-pay

## 2023-01-11 ENCOUNTER — Inpatient Hospital Stay (HOSPITAL_COMMUNITY)
Admission: AD | Admit: 2023-01-11 | Discharge: 2023-01-11 | Disposition: A | Discharge status: Home / Self Care | Payer: Medicaid Other | Attending: Obstetrics and Gynecology | Admitting: Obstetrics and Gynecology

## 2023-01-11 DIAGNOSIS — O4703 False labor before 37 completed weeks of gestation, third trimester: Secondary | ICD-10-CM | POA: Insufficient documentation

## 2023-01-11 DIAGNOSIS — N898 Other specified noninflammatory disorders of vagina: Secondary | ICD-10-CM | POA: Diagnosis not present

## 2023-01-11 DIAGNOSIS — Z3A25 25 weeks gestation of pregnancy: Secondary | ICD-10-CM | POA: Diagnosis not present

## 2023-01-11 DIAGNOSIS — O26892 Other specified pregnancy related conditions, second trimester: Secondary | ICD-10-CM | POA: Diagnosis not present

## 2023-01-11 DIAGNOSIS — O479 False labor, unspecified: Secondary | ICD-10-CM

## 2023-01-11 LAB — WET PREP, GENITAL
Clue Cells Wet Prep HPF POC: NONE SEEN
Sperm: NONE SEEN
Trich, Wet Prep: NONE SEEN
WBC, Wet Prep HPF POC: <10 (ref <10)
Yeast Wet Prep HPF POC: NONE SEEN

## 2023-01-11 LAB — URINALYSIS, ROUTINE W REFLEX MICROSCOPIC
Bilirubin Urine: NEGATIVE
Glucose, UA: NEGATIVE mg/dL
Hgb urine dipstick: NEGATIVE
Ketones, ur: NEGATIVE mg/dL
Leukocytes,Ua: NEGATIVE
Nitrite: NEGATIVE
Protein, ur: NEGATIVE mg/dL
Specific Gravity, Urine: 1.03 (ref 1.005–1.030)
pH: 5 (ref 5.0–8.0)

## 2023-01-11 LAB — RUPTURE OF MEMBRANE (ROM)PLUS: Rom Plus: NEGATIVE

## 2023-01-11 NOTE — MAU Note (Signed)
.  Jaime Payne is a 27 y.o. at [redacted]w[redacted]d here in MAU reporting: pain and pressure in lower abdomen that began today.  Reports she had a gush of fluid today at 1130 this morning, but states she noticed leaking last Sunday and fluid has continued to trickle all week.  Reports abdominal pain started after gush of fluid.Denies VB.  Endorses +FM, less than usual. LMP: NA Onset of complaint: last Sunday Pain score: 7 Vitals:   01/11/23 1807  BP: 121/68  Pulse: 82  Resp: 20  Temp: 98 F (36.7 C)  SpO2: 98%     FHT: 154 bpm Lab orders placed from triage:   UA

## 2023-01-11 NOTE — MAU Provider Note (Signed)
History     [id number]  Arrival date and time: 01/11/23 1721    Chief Complaint  Patient presents with   Abdominal Pain   Rupture of Membranes     HPI Jaime Payne is a 27 y.o. at [redacted]w[redacted]d by [place] who presented for concern of ruptured membranes.  She notes that she had 2 episodes- once last week and then again this am spontaneous leaking of clear fluid.  Initially she thought it was urine, but states this was different.  It does not seem like there were further episodes of leaking since this am.  Denies vaginal bleeding.  Denies pelvic or abdominal pain.  She reports no other acute complaints.  She is mostly concerned that she may be ruptured.  Pt receives care with [facility] OB records not in charg  Per pt- Pregnancy complicated by: ?marginal cord insertion  Past Medical History:  Diagnosis Date   Anxiety    Asthma    Depression    Leukemia (HCC)    age [age]    Past Surgical History:  Procedure Laterality Date   PORTACATH PLACEMENT     RHINOPLASTY  2019    Family History  Problem Relation Age of Onset   Anxiety disorder Mother    Diabetes Mother    Hypertension Mother    Diabetes Father    Hypertension Father    Hyperlipidemia Father    Birth defects Maternal Grandfather    Birth defects Paternal Grandfather        liver    No Known Allergies  No current facility-administered medications on file prior to encounter.   Current Outpatient Medications on File Prior to Encounter  Medication Sig Dispense Refill   Prenatal Vit-Fe Fumarate-FA (PRENATAL MULTIVITAMIN) TABS tablet Take 1 tablet by mouth daily at 12 noon.       Review of Systems  Constitutional: Negative.   HENT: Negative.    Eyes: Negative.   Respiratory: Negative.    Cardiovascular: Negative.   Gastrointestinal: Negative.   Genitourinary: Negative.   Musculoskeletal: Negative.   Skin: Negative.   Neurological: Negative.   Endo/Heme/Allergies: Negative.   Psychiatric/Behavioral:  Negative.     Pertinent positives and negative per HPI, all others reviewed and negative  Physical Exam   BP 121/68 (BP Location: Right Arm)   Pulse 82   Temp 98 F (36.7 C) (Oral)   Resp 20   Ht 5\' 5"  (1.651 m)   Wt 94.8 kg   LMP 07/04/2022 (Approximate)   SpO2 98%   BMI 34.76 kg/m   Patient Vitals for the past 24 hrs:  BP Temp Temp src Pulse Resp SpO2 Height Weight  01/11/23 1807 121/68 98 F (36.7 C) Oral 82 20 98 % -- --  01/11/23 1801 -- -- -- -- -- -- 5\' 5"  (1.651 m) 94.8 kg    Physical Exam Constitutional:      Appearance: She is well-developed.  Cardiovascular:     Rate and Rhythm: Normal rate and regular rhythm.  Pulmonary:     Effort: Pulmonary effort is normal.     Breath sounds: Normal breath sounds.  Abdominal:     Comments: Gravid, soft and non-tender  Genitourinary:    Comments: GU: normal external genitalia.  ROM plus obtained.  SSE: Negative pooling.  Cervix appear closed, copious clumpy white discharge noted. On SVE: closed/thick/high Skin:    General: Skin is warm and dry.  Neurological:     General: No focal deficit present.  Mental Status: She is alert.  Psychiatric:        Mood and Affect: Mood normal.        Behavior: Behavior normal.      FHT Baseline 140bpm, moderate variability, +10x10 accels, absent decels Reactive- appropriate for current gestational age  Toco: no contractions  Labs Results for orders placed or performed during the hospital encounter of 01/11/23 (from the past 24 hour(s))  Urinalysis, Routine w reflex microscopic -Urine, Clean Catch     Status: Abnormal   Collection Time: 01/11/23  6:15 PM  Result Value Ref Range   Color, Urine YELLOW YELLOW   APPearance HAZY (A) CLEAR   Specific Gravity, Urine 1.030 1.005 - 1.030   pH 5.0 5.0 - 8.0   Glucose, UA NEGATIVE NEGATIVE mg/dL   Hgb urine dipstick NEGATIVE NEGATIVE   Bilirubin Urine NEGATIVE NEGATIVE   Ketones, ur NEGATIVE NEGATIVE mg/dL   Protein, ur NEGATIVE  NEGATIVE mg/dL   Nitrite NEGATIVE NEGATIVE   Leukocytes,Ua NEGATIVE NEGATIVE  Rupture of Membrane (ROM) Plus     Status: None   Collection Time: 01/11/23  6:44 PM  Result Value Ref Range   Rom Plus NEGATIVE   Wet prep, genital     Status: None   Collection Time: 01/11/23  6:44 PM  Result Value Ref Range   Yeast Wet Prep HPF POC NONE SEEN NONE SEEN   Trich, Wet Prep NONE SEEN NONE SEEN   Clue Cells Wet Prep HPF POC NONE SEEN NONE SEEN   WBC, Wet Prep HPF POC <10 <10   Sperm NONE SEEN     MAU Course  Procedures Lab Orders         Wet prep, genital         Urinalysis, Routine w reflex microscopic -Urine, Clean Catch         Rupture of Membrane (ROM) Plus    No orders of the defined types were placed in this encounter.  Imaging Orders  No imaging studies ordered today    MDM -physical exam completed -Negative fern test, UA, ROM plus, wet prep collected -Appropriate FHT for current gestational age  Assessment and Plan   1. Vaginal discharge during pregnancy in second trimester   2. False labor    -work up for rupture, infection and UA were all negative -reassured pt of physiologic discharge in pregnancy -FHT reassuring -plan for discharge home in stable condition -follow up as scheduled  Myna Hidalgo, DO Attending Obstetrician & Gynecologist, [facility] for [employer], [facility]

## 2023-02-03 NOTE — L&D Delivery Note (Signed)
 Delivery Note Jaime Payne is Jaime G3 now P2012 s/p SVD at [redacted]w[redacted]d. Jaime healthy baby girl was delivered at 22:41 via vertex presentation.  APGAR: 9, 9; weight  .     Admitted for induction of labor for term dates. Induced with pitocin and AROM. Progressed normally. Received epidural for pain management. Pushed for approximately 5 minutes. Baby was delivered without difficulty. Nuchal cord x1, reduced after delivery of the body.  Delayed cord clamping for 60 seconds. Delivery of placenta was spontaneous. Placenta was found to be intact, meconium stained, marginal cord insertion, 3 -vessel cord was noted. The fundus was found to be firm. 1st degree perineal laceration was repaired in the normal sterile fashion with 3-0 vicryl rapide. Estimated blood loss 300cc. Instrument and gauze counts were correct at the end of the procedure.  Anesthesia:  epidural Episiotomy:  none Lacerations:  1st degree perineal Suture Repair: 3.0 vicryl rapide Est. Blood Loss (mL):  300cc  Mom to postpartum.  Baby to Couplet care / Skin to Skin.  Jaime Payne Jaime Payne 04/22/2023, 11:17 PM

## 2023-03-30 LAB — OB RESULTS CONSOLE GBS: GBS: NEGATIVE

## 2023-04-06 ENCOUNTER — Other Ambulatory Visit: Payer: Self-pay

## 2023-04-06 ENCOUNTER — Encounter (HOSPITAL_COMMUNITY): Payer: Self-pay | Admitting: Obstetrics and Gynecology

## 2023-04-06 ENCOUNTER — Inpatient Hospital Stay (HOSPITAL_COMMUNITY)
Admission: AD | Admit: 2023-04-06 | Discharge: 2023-04-06 | Disposition: A | Discharge status: Home / Self Care | Attending: Obstetrics and Gynecology | Admitting: Obstetrics and Gynecology

## 2023-04-06 DIAGNOSIS — O471 False labor at or after 37 completed weeks of gestation: Secondary | ICD-10-CM | POA: Insufficient documentation

## 2023-04-06 DIAGNOSIS — Z3A37 37 weeks gestation of pregnancy: Secondary | ICD-10-CM | POA: Diagnosis not present

## 2023-04-06 DIAGNOSIS — O479 False labor, unspecified: Secondary | ICD-10-CM

## 2023-04-06 NOTE — Progress Notes (Signed)
 I have communicated with Dr. Judd Lien and reviewed vital signs:  Vitals:   04/06/23 0551 04/06/23 0710  BP: 116/67 112/70  Pulse: 95 92  Resp: 18   Temp: 98.3 F (36.8 C)   SpO2: 98% 99%    Vaginal exam:  Dilation: 1 Effacement (%): 50 Station: Ballotable Presentation: Undeterminable Exam by:: [employer], RN,   Also reviewed contraction pattern and that non-stress test is reactive.  It has been documented that patient is contracting every 2-7 minutes with no cervical change over 1 hour not indicating active labor.  Patient denies any other complaints.  Based on this report provider has given order for discharge.  A discharge order and diagnosis entered by a provider.   Labor discharge instructions reviewed with patient.

## 2023-04-06 NOTE — MAU Provider Note (Signed)
    S: Jaime Payne is a 28 y.o. G3P1011 at [redacted]w[redacted]d  who presents to MAU today complaining contractions q 10 minutes since 0200. She denies vaginal bleeding. She denies LOF. She reports normal fetal movement.    O: BP 116/67 (BP Location: Right Arm)   Pulse 95   Temp 98.3 F (36.8 C) (Oral)   Resp 18   Ht 5\' 5"  (1.651 m)   Wt 99.8 kg   LMP 07/04/2022 (Approximate)   SpO2 98%   BMI 36.61 kg/m  GENERAL: Well-developed, well-nourished female in no acute distress.  HEAD: Normocephalic, atraumatic.  CHEST: Normal effort of breathing, regular heart rate ABDOMEN: Soft, nontender, gravid  Cervical exam:  Dilation: 1 Effacement (%): 50 Station: Ballotable Presentation: Undeterminable Exam by:: [employer], RN   Fetal Monitoring: Baseline: 135 Variability: moderate Accelerations: + Decelerations: none  Contractions: q53mins   Plan to recheck in 1 hr with continuous NST.  Unchanged.   A/P SIUP at [redacted]w[redacted]d  False labor  Strict / usual return precautions.    Hessie Dibble, MD 04/06/2023 6:10 AM

## 2023-04-06 NOTE — MAU Note (Signed)
.  Jaime Payne is a 28 y.o. at [redacted]w[redacted]d here in MAU reporting: ctx since 0200 - every 10 minutes. Denies VB or LOF. +FM. Also having constant lower back pain.  Onset of complaint: 0200 Pain score: 7 - ctx; 9 - back Vitals:   04/06/23 0551  BP: 116/67  Pulse: 95  Resp: 18  Temp: 98.3 F (36.8 C)  SpO2: 98%     FHT: 141  Lab orders placed from triage: labor eval

## 2023-04-09 ENCOUNTER — Encounter (HOSPITAL_COMMUNITY): Payer: Self-pay | Admitting: Obstetrics and Gynecology

## 2023-04-09 ENCOUNTER — Inpatient Hospital Stay (HOSPITAL_COMMUNITY)
Admission: AD | Admit: 2023-04-09 | Discharge: 2023-04-09 | Disposition: A | Discharge status: Home / Self Care | Payer: Self-pay | Attending: Obstetrics and Gynecology | Admitting: Obstetrics and Gynecology

## 2023-04-09 DIAGNOSIS — Z3A37 37 weeks gestation of pregnancy: Secondary | ICD-10-CM

## 2023-04-09 DIAGNOSIS — R102 Pelvic and perineal pain unspecified side: Secondary | ICD-10-CM

## 2023-04-09 DIAGNOSIS — O471 False labor at or after 37 completed weeks of gestation: Secondary | ICD-10-CM | POA: Insufficient documentation

## 2023-04-09 DIAGNOSIS — O479 False labor, unspecified: Secondary | ICD-10-CM

## 2023-04-09 MED ORDER — CYCLOBENZAPRINE HCL 5 MG PO TABS
10.0000 mg | ORAL_TABLET | Freq: Once | ORAL | Status: AC
  Administered 2023-04-09: 10 mg via ORAL
  Filled 2023-04-09: qty 2

## 2023-04-09 MED ORDER — ACETAMINOPHEN 500 MG PO TABS
1000.0000 mg | ORAL_TABLET | Freq: Once | ORAL | Status: AC
  Administered 2023-04-09: 1000 mg via ORAL
  Filled 2023-04-09: qty 2

## 2023-04-09 NOTE — MAU Provider Note (Signed)
 History     [id number]  Arrival date and time: 04/09/23 5784    Chief Complaint  Patient presents with   Pelvic Pain   Vaginal Bleeding     HPI Jaime Payne is a 28 y.o. at [redacted]w[redacted]d by LMP with PMHx notable for anxiety on zoloft, asthma, who presents for pelvic pain and pressure.   Review of outside prenatal records from [facility] (in media tab):  - Marginal umbilical cord insertion - Zoloft for Anxiety/Depression since 1/18 - Hx of gHTN in prior pregnancy  Vaginal bleeding: No LOF: No Fetal Movement: Yes Contractions: Yes, irregular     OB History     Gravida  3   Para  1   Term  1   Preterm      AB  1   Living  1      SAB      IAB  1   Ectopic      Multiple  0   Live Births  1           Past Medical History:  Diagnosis Date   Anxiety    Asthma    Depression    Leukemia (HCC)    [age]    Past Surgical History:  Procedure Laterality Date   PORTACATH PLACEMENT     RHINOPLASTY  2019    Family History  Problem Relation Age of Onset   Anxiety disorder Mother    Diabetes Mother    Hypertension Mother    Diabetes Father    Hypertension Father    Hyperlipidemia Father    Birth defects Maternal Grandfather    Birth defects Paternal Grandfather        liver    Social History   Socioeconomic History   Marital status: Single    Spouse name: Not on file   Number of children: Not on file   Years of education: Not on file   Highest education level: Not on file  Occupational History   Not on file  Tobacco Use   Smoking status: Never   Smokeless tobacco: Never  Vaping Use   Vaping status: Never Used  Substance and Sexual Activity   Alcohol use: Never   Drug use: Never   Sexual activity: Not Currently  Other Topics Concern   Not on file  Social History Narrative   Not on file   Social Drivers of Health   Financial Resource Strain: Not on file  Food Insecurity: Not on file  Transportation Needs: Not on  file  Physical Activity: Not on file  Stress: Not on file  Social Connections: Not on file  Intimate Partner Violence: Not on file    No Known Allergies  No current facility-administered medications on file prior to encounter.   Current Outpatient Medications on File Prior to Encounter  Medication Sig Dispense Refill   sertraline (ZOLOFT) 50 MG tablet Take 50 mg by mouth daily.     Prenatal Vit-Fe Fumarate-FA (PRENATAL MULTIVITAMIN) TABS tablet Take 1 tablet by mouth daily at 12 noon.       ROS Pertinent positives and negative per HPI, all others reviewed and negative  Physical Exam   BP 105/60   Pulse 80   Temp 97.6 F (36.4 C) (Oral)   Resp 16   Ht 5\' 5"  (1.651 m)   Wt 101.7 kg   LMP 07/04/2022 (Approximate)   SpO2 98%   BMI 37.33 kg/m   Patient Vitals for  the past 24 hrs:  BP Temp Temp src Pulse Resp SpO2 Height Weight  04/09/23 1150 105/60 -- -- 80 -- 98 % -- --  04/09/23 1014 113/60 -- -- 92 -- 97 % -- --  04/09/23 1008 118/62 97.6 F (36.4 C) Oral 96 16 99 % -- --  04/09/23 1001 -- -- -- -- -- -- 5\' 5"  (1.651 m) 101.7 kg    Physical Exam  4cm  Blood tinged mucus No bleeding  Cervical Exam Dilation: 4 Exam by:: Georgina Snell, RN  FHT Baseline 120, moderate variability, +accels, -decels Toco: Irregular, infrequent Cat: I  Labs No results found for this or any previous visit (from the past 24 hours).  Imaging No results found.  MAU Course  Procedures Lab Orders  No laboratory test(s) ordered today   Meds ordered this encounter  Medications   acetaminophen (TYLENOL) tablet 1,000 mg   cyclobenzaprine (FLEXERIL) tablet 10 mg   Imaging Orders  No imaging studies ordered today    MDM moderate  Assessment and Plan  [redacted] week gestation with latent labor, no cervical change since prior exam in the office Suspect loss of mucus plug  Trial of tylenol and flexeril with mild improvement  #FWB FHT Cat 1 NST: Reactive  Plan: Discharge  home Labor and return precautions provided Continue routine prenatal care as scheduled   Wyn Forster, MD 04/09/23 11:56 AM  Allergies as of 04/09/2023   No Known Allergies      Medication List     TAKE these medications    prenatal multivitamin Tabs tablet Take 1 tablet by mouth daily at 12 noon.   sertraline 50 MG tablet Commonly known as: ZOLOFT Take 50 mg by mouth daily.

## 2023-04-09 NOTE — MAU Note (Addendum)
.  Jaime Payne is a 28 y.o. at [redacted]w[redacted]d here in MAU reporting: constant pelvic pain that started last night and has continued into today. She reports trying pregnancy support belt without relief. Denies any contractions, recent IC, or LOF. Did feel less FM last night, but has felt normal movement today. She also reports vaginal spotting this morning and passing a few "very small" clots in the shower, "less than 1 inch". Is wearing a pad, only reports seeing spotting on the pad. Last SVE was yesterday in the office, 4 cm.   Marginal cord insertion.  LMP: N/A Onset of complaint: Yesterday Pain score: 7/10 Vitals:   04/09/23 1008  BP: 118/62  Pulse: 96  Resp: 16  Temp: 97.6 F (36.4 C)  SpO2: 99%     FHT: 135  Lab orders placed from triage: N/A

## 2023-04-19 ENCOUNTER — Telehealth (HOSPITAL_COMMUNITY): Payer: Self-pay | Admitting: *Deleted

## 2023-04-19 ENCOUNTER — Encounter (HOSPITAL_COMMUNITY): Payer: Self-pay

## 2023-04-19 ENCOUNTER — Encounter (HOSPITAL_COMMUNITY): Payer: Self-pay | Admitting: *Deleted

## 2023-04-19 NOTE — Telephone Encounter (Signed)
 Preadmission screen

## 2023-04-22 ENCOUNTER — Inpatient Hospital Stay (HOSPITAL_COMMUNITY)
Admission: RE | Admit: 2023-04-22 | Discharge: 2023-04-24 | DRG: 807 | Disposition: A | Discharge status: Home / Self Care | Source: Ambulatory Visit | Attending: Obstetrics and Gynecology | Admitting: Obstetrics and Gynecology

## 2023-04-22 ENCOUNTER — Encounter (HOSPITAL_COMMUNITY): Payer: Self-pay | Admitting: Obstetrics and Gynecology

## 2023-04-22 ENCOUNTER — Inpatient Hospital Stay (HOSPITAL_COMMUNITY): Admitting: Anesthesiology

## 2023-04-22 DIAGNOSIS — O26893 Other specified pregnancy related conditions, third trimester: Secondary | ICD-10-CM | POA: Diagnosis present

## 2023-04-22 DIAGNOSIS — O9902 Anemia complicating childbirth: Secondary | ICD-10-CM | POA: Diagnosis present

## 2023-04-22 DIAGNOSIS — Z8249 Family history of ischemic heart disease and other diseases of the circulatory system: Secondary | ICD-10-CM | POA: Diagnosis not present

## 2023-04-22 DIAGNOSIS — F32A Depression, unspecified: Secondary | ICD-10-CM | POA: Diagnosis present

## 2023-04-22 DIAGNOSIS — O99344 Other mental disorders complicating childbirth: Secondary | ICD-10-CM | POA: Diagnosis present

## 2023-04-22 DIAGNOSIS — Z833 Family history of diabetes mellitus: Secondary | ICD-10-CM | POA: Diagnosis not present

## 2023-04-22 DIAGNOSIS — O99214 Obesity complicating childbirth: Secondary | ICD-10-CM | POA: Diagnosis present

## 2023-04-22 DIAGNOSIS — Z79899 Other long term (current) drug therapy: Secondary | ICD-10-CM

## 2023-04-22 DIAGNOSIS — O43193 Other malformation of placenta, third trimester: Secondary | ICD-10-CM | POA: Diagnosis present

## 2023-04-22 DIAGNOSIS — Z3A39 39 weeks gestation of pregnancy: Secondary | ICD-10-CM

## 2023-04-22 DIAGNOSIS — Z3493 Encounter for supervision of normal pregnancy, unspecified, third trimester: Principal | ICD-10-CM

## 2023-04-22 DIAGNOSIS — O1204 Gestational edema, complicating childbirth: Secondary | ICD-10-CM | POA: Diagnosis not present

## 2023-04-22 LAB — CBC
HCT: 32.2 % — ABNORMAL LOW (ref 36.0–46.0)
Hemoglobin: 10.7 g/dL — ABNORMAL LOW (ref 12.0–15.0)
MCH: 28.9 pg (ref 26.0–34.0)
MCHC: 33.2 g/dL (ref 30.0–36.0)
MCV: 87 fL (ref 80.0–100.0)
Platelets: 258 10*3/uL (ref 150–400)
RBC: 3.7 MIL/uL — ABNORMAL LOW (ref 3.87–5.11)
RDW: 14.6 % (ref 11.5–15.5)
WBC: 11.9 10*3/uL — ABNORMAL HIGH (ref 4.0–10.5)
nRBC: 0 % (ref 0.0–0.2)

## 2023-04-22 LAB — TYPE AND SCREEN
ABO/RH(D): O POS
Antibody Screen: NEGATIVE

## 2023-04-22 MED ORDER — OXYCODONE-ACETAMINOPHEN 5-325 MG PO TABS: 2.0000 | ORAL_TABLET | ORAL | Status: DC | PRN

## 2023-04-22 MED ORDER — LACTATED RINGERS IV SOLN: 500.0000 mL | Freq: Once | INTRAVENOUS | Status: DC

## 2023-04-22 MED ORDER — FENTANYL-BUPIVACAINE-NACL 0.5-0.125-0.9 MG/250ML-% EP SOLN
12.0000 mL/h | EPIDURAL | Status: DC | PRN
  Administered 2023-04-22: 12 mL/h via EPIDURAL
  Filled 2023-04-22: qty 250

## 2023-04-22 MED ORDER — OXYTOCIN-SODIUM CHLORIDE 30-0.9 UT/500ML-% IV SOLN
2.5000 [IU]/h | INTRAVENOUS | Status: DC
  Administered 2023-04-22: 2.5 [IU]/h via INTRAVENOUS

## 2023-04-22 MED ORDER — LACTATED RINGERS IV SOLN: 500.0000 mL | INTRAVENOUS | Status: DC | PRN

## 2023-04-22 MED ORDER — OXYTOCIN-SODIUM CHLORIDE 30-0.9 UT/500ML-% IV SOLN
1.0000 m[IU]/min | INTRAVENOUS | Status: DC
  Administered 2023-04-22: 2 m[IU]/min via INTRAVENOUS
  Filled 2023-04-22: qty 500

## 2023-04-22 MED ORDER — LIDOCAINE HCL (PF) 1 % IJ SOLN
INTRAMUSCULAR | Status: DC | PRN
  Administered 2023-04-22: 8 mL via EPIDURAL

## 2023-04-22 MED ORDER — DIPHENHYDRAMINE HCL 50 MG/ML IJ SOLN: 12.5000 mg | INTRAMUSCULAR | Status: DC | PRN

## 2023-04-22 MED ORDER — OXYTOCIN BOLUS FROM INFUSION
333.0000 mL | Freq: Once | INTRAVENOUS | Status: AC
  Administered 2023-04-22: 333 mL via INTRAVENOUS

## 2023-04-22 MED ORDER — LACTATED RINGERS IV SOLN: INTRAVENOUS | Status: DC

## 2023-04-22 MED ORDER — PHENYLEPHRINE 80 MCG/ML (10ML) SYRINGE FOR IV PUSH (FOR BLOOD PRESSURE SUPPORT): 80.0000 ug | PREFILLED_SYRINGE | INTRAVENOUS | Status: DC | PRN

## 2023-04-22 MED ORDER — OXYCODONE-ACETAMINOPHEN 5-325 MG PO TABS: 1.0000 | ORAL_TABLET | ORAL | Status: DC | PRN

## 2023-04-22 MED ORDER — TERBUTALINE SULFATE 1 MG/ML IJ SOLN: 0.2500 mg | Freq: Once | INTRAMUSCULAR | Status: DC | PRN

## 2023-04-22 MED ORDER — EPHEDRINE 5 MG/ML INJ: 10.0000 mg | INTRAVENOUS | Status: DC | PRN

## 2023-04-22 MED ORDER — SOD CITRATE-CITRIC ACID 500-334 MG/5ML PO SOLN: 30.0000 mL | ORAL | Status: DC | PRN

## 2023-04-22 MED ORDER — ONDANSETRON HCL 4 MG/2ML IJ SOLN: 4.0000 mg | Freq: Four times a day (QID) | INTRAMUSCULAR | Status: DC | PRN

## 2023-04-22 MED ORDER — LIDOCAINE HCL (PF) 1 % IJ SOLN: 30.0000 mL | INTRAMUSCULAR | Status: DC | PRN

## 2023-04-22 MED ORDER — PHENYLEPHRINE 80 MCG/ML (10ML) SYRINGE FOR IV PUSH (FOR BLOOD PRESSURE SUPPORT)
80.0000 ug | PREFILLED_SYRINGE | INTRAVENOUS | Status: DC | PRN
  Filled 2023-04-22: qty 10

## 2023-04-22 MED ORDER — ACETAMINOPHEN 325 MG PO TABS: 650.0000 mg | ORAL_TABLET | ORAL | Status: DC | PRN

## 2023-04-22 MED ORDER — FENTANYL-BUPIVACAINE-NACL 0.5-0.125-0.9 MG/250ML-% EP SOLN: 12.0000 mL/h | EPIDURAL | Status: DC | PRN

## 2023-04-22 NOTE — Progress Notes (Signed)
 Jaime Payne is a 28 y.o. G32P1011 female at [redacted]w[redacted]d presenting for elective induction of labor at term.  Subjective: Feeling somewhat more uncomfortable with contractions  Objective:    04/22/2023    8:03 PM 04/22/2023    7:00 PM 04/22/2023    6:31 PM  Vitals with BMI  Systolic 121 125 696  Diastolic 70 67 76  Pulse 96 96 107     FHT:  FHR: 140 bpm, variability: moderate,  accelerations:  Present,  decelerations:  Absent UC:   regular, every 3 minutes SVE:   Dilation: 5 Effacement (%): 50 Station: -3 Exam by:: Janeka Libman MD  Labs: Lab Results  Component Value Date   WBC 11.9 (H) 04/22/2023   HGB 10.7 (L) 04/22/2023   HCT 32.2 (L) 04/22/2023   MCV 87.0 04/22/2023   PLT 258 04/22/2023    Assessment / Plan: Jaime Payne is a 28 y.o. G36P1011 female at [redacted]w[redacted]d presenting for elective induction of labor at term. -IOL: SVE now 5/50/-3, AROM light mec at 20:00. Continue to titrate pitocin. -FWB: Category 1 -pain management: requests epidural after increase in pain s/p AROM -marginal cord insertion -depressive disorder: on zoloft 50mg  daily -obesity -past pregnancy h/o gHTN: on ASA this pregnancy, normotensive -asthma: no meds   Dispo: Anticipate SVD    Willa Frater, MD 04/22/2023, 8:44 PM

## 2023-04-22 NOTE — H&P (Signed)
 Jaime Payne is Payne 28 y.o. G89P1011 female at [redacted]w[redacted]d presenting for elective induction of labor at term. She is doing well. Feeling baby move well. No symptoms or concerns. First baby 9lb1oz.   Her pregnancy is otherwise complicated by: -marginal cord insertion -depressive disorder: on zoloft 50mg  daily -obesity -past pregnancy h/o gHTN: on ASA this pregnancy, normotensive -asthma: no meds  OB History     Gravida  3   Para  1   Term  1   Preterm      AB  1   Living  1      SAB      IAB  1   Ectopic      Multiple  0   Live Births  1          Past Medical History:  Diagnosis Date   Anxiety    Asthma    Depression    Leukemia (HCC)    age [age]   Past Surgical History:  Procedure Laterality Date   PORTACATH PLACEMENT     RHINOPLASTY  2019   Family History: family history includes Anxiety disorder in her mother; Birth defects in her maternal grandfather and paternal grandfather; Diabetes in her father and mother; Hyperlipidemia in her father; Hypertension in her father and mother. Social History:  reports that she has never smoked. She has never used smokeless tobacco. She reports that she does not drink alcohol and does not use drugs.     Maternal Diabetes: No Genetic Screening: Normal Maternal Ultrasounds/Referrals: Normal Fetal Ultrasounds or other Referrals:  None Maternal Substance Abuse:  No Significant Maternal Medications:  Meds include: Zoloft Significant Maternal Lab Results:  Group B Strep negative Number of Prenatal Visits:greater than 3 verified prenatal visits Maternal Vaccinations:TDap and Flu   Review of Systems  All other systems reviewed and are negative.  Maternal Medical History:  Fetal activity: Perceived fetal activity is normal.   Prenatal Complications - Diabetes: none.     Last menstrual period 07/04/2022. Maternal Exam:  Abdomen: Patient reports no abdominal tenderness.   Physical Exam Vitals reviewed.   Constitutional:      Appearance: Normal appearance.  HENT:     Nose: Nose normal.  Eyes:     Extraocular Movements: Extraocular movements intact.  Cardiovascular:     Comments: Well perfused Pulmonary:     Effort: Pulmonary effort is normal.  Abdominal:     Comments: Gravid, non-tender  Musculoskeletal:     Cervical back: Normal range of motion.  Skin:    General: Skin is warm and dry.  Neurological:     General: No focal deficit present.     Mental Status: She is alert and oriented to person, place, and time.  Psychiatric:        Mood and Affect: Mood normal.        Behavior: Behavior normal.        Thought Content: Thought content normal.        Judgment: Judgment normal.     Prenatal labs: ABO, Rh:   Antibody: Negative (08/21 0000) Rubella: Immune (08/21 0000) RPR: Nonreactive (08/21 0000)  HBsAg: Negative (08/21 0000)  HIV: Non-reactive (08/21 0000)  GBS: Negative/-- (02/25 0000)   Assessment/Plan: Jaime Payne is Payne 28 y.o. G84P1011 female at [redacted]w[redacted]d presenting for elective induction of labor at term. -IOL: SVE at clinic was 4cm, unchanged on admission. Pitocin ordered. Plan AROM when appropriate. -FWB: Category 1 -marginal cord insertion -depressive disorder: on zoloft  50mg  daily -obesity -past pregnancy h/o gHTN: on ASA this pregnancy, normotensive -asthma: no meds  Dispo: Anticipate SVD  Jaime Payne Jaime Payne 04/22/2023, 2:05 PM

## 2023-04-22 NOTE — Anesthesia Procedure Notes (Signed)
 Epidural Patient location during procedure: OB Start time: 04/22/2023 8:30 PM End time: 04/22/2023 8:35 PM  Staffing Anesthesiologist: Bethena Midget, MD  Preanesthetic Checklist Completed: patient identified, IV checked, site marked, risks and benefits discussed, surgical consent, monitors and equipment checked, pre-op evaluation and timeout performed  Epidural Patient position: sitting Prep: DuraPrep and site prepped and draped Patient monitoring: continuous pulse ox and blood pressure Approach: midline Location: L4-L5 Injection technique: LOR air  Needle:  Needle type: Tuohy  Needle gauge: 17 G Needle length: 9 cm and 9 Needle insertion depth: 6 cm Catheter type: closed end flexible Catheter size: 19 Gauge Catheter at skin depth: 12 cm Test dose: negative  Assessment Events: blood not aspirated, no cerebrospinal fluid, injection not painful, no injection resistance, no paresthesia and negative IV test

## 2023-04-23 ENCOUNTER — Encounter (HOSPITAL_COMMUNITY): Payer: Self-pay | Admitting: Obstetrics and Gynecology

## 2023-04-23 LAB — CBC
HCT: 26.9 % — ABNORMAL LOW (ref 36.0–46.0)
Hemoglobin: 8.9 g/dL — ABNORMAL LOW (ref 12.0–15.0)
MCH: 28.5 pg (ref 26.0–34.0)
MCHC: 33.1 g/dL (ref 30.0–36.0)
MCV: 86.2 fL (ref 80.0–100.0)
Platelets: 211 10*3/uL (ref 150–400)
RBC: 3.12 MIL/uL — ABNORMAL LOW (ref 3.87–5.11)
RDW: 14.4 % (ref 11.5–15.5)
WBC: 13 10*3/uL — ABNORMAL HIGH (ref 4.0–10.5)
nRBC: 0 % (ref 0.0–0.2)

## 2023-04-23 LAB — RPR: RPR Ser Ql: NONREACTIVE

## 2023-04-23 MED ORDER — ACETAMINOPHEN 500 MG PO TABS
1000.0000 mg | ORAL_TABLET | Freq: Three times a day (TID) | ORAL | Status: DC
  Administered 2023-04-23 (×2): 1000 mg via ORAL
  Filled 2023-04-23 (×2): qty 2

## 2023-04-23 MED ORDER — DIPHENHYDRAMINE HCL 25 MG PO CAPS: 25.0000 mg | ORAL_CAPSULE | Freq: Four times a day (QID) | ORAL | Status: DC | PRN

## 2023-04-23 MED ORDER — DIBUCAINE (PERIANAL) 1 % EX OINT: 1.0000 | TOPICAL_OINTMENT | CUTANEOUS | Status: DC | PRN

## 2023-04-23 MED ORDER — SIMETHICONE 80 MG PO CHEW: 80.0000 mg | CHEWABLE_TABLET | ORAL | Status: DC | PRN

## 2023-04-23 MED ORDER — TETANUS-DIPHTH-ACELL PERTUSSIS 5-2.5-18.5 LF-MCG/0.5 IM SUSY: 0.5000 mL | PREFILLED_SYRINGE | Freq: Once | INTRAMUSCULAR | Status: DC

## 2023-04-23 MED ORDER — ONDANSETRON HCL 4 MG/2ML IJ SOLN: 4.0000 mg | INTRAMUSCULAR | Status: DC | PRN

## 2023-04-23 MED ORDER — BENZOCAINE-MENTHOL 20-0.5 % EX AERO
1.0000 | INHALATION_SPRAY | CUTANEOUS | Status: DC | PRN
  Filled 2023-04-23: qty 56

## 2023-04-23 MED ORDER — SENNOSIDES-DOCUSATE SODIUM 8.6-50 MG PO TABS
2.0000 | ORAL_TABLET | Freq: Every day | ORAL | Status: DC
  Administered 2023-04-23: 2 via ORAL
  Filled 2023-04-23: qty 2

## 2023-04-23 MED ORDER — PRENATAL MULTIVITAMIN CH
1.0000 | ORAL_TABLET | Freq: Every day | ORAL | Status: DC
  Administered 2023-04-23: 1 via ORAL
  Filled 2023-04-23: qty 1

## 2023-04-23 MED ORDER — IBUPROFEN 600 MG PO TABS
600.0000 mg | ORAL_TABLET | Freq: Four times a day (QID) | ORAL | Status: DC
  Administered 2023-04-23 – 2023-04-24 (×5): 600 mg via ORAL
  Filled 2023-04-23 (×5): qty 1

## 2023-04-23 MED ORDER — ACETAMINOPHEN 325 MG PO TABS
650.0000 mg | ORAL_TABLET | ORAL | Status: DC | PRN
  Administered 2023-04-23: 650 mg via ORAL
  Filled 2023-04-23: qty 2

## 2023-04-23 MED ORDER — COCONUT OIL OIL
1.0000 | TOPICAL_OIL | Status: DC | PRN
  Administered 2023-04-24: 1 via TOPICAL

## 2023-04-23 MED ORDER — SERTRALINE HCL 50 MG PO TABS
50.0000 mg | ORAL_TABLET | Freq: Every day | ORAL | Status: DC
  Administered 2023-04-23: 50 mg via ORAL
  Filled 2023-04-23: qty 1

## 2023-04-23 MED ORDER — ONDANSETRON HCL 4 MG PO TABS: 4.0000 mg | ORAL_TABLET | ORAL | Status: DC | PRN

## 2023-04-23 MED ORDER — WITCH HAZEL-GLYCERIN EX PADS: 1.0000 | MEDICATED_PAD | CUTANEOUS | Status: DC | PRN

## 2023-04-23 NOTE — Anesthesia Postprocedure Evaluation (Signed)
 Anesthesia Post Note  Patient: Jaime Payne  Procedure(s) Performed: AN AD HOC LABOR EPIDURAL     Patient location during evaluation: Mother Baby Anesthesia Type: Epidural Level of consciousness: awake and alert Pain management: pain level controlled Vital Signs Assessment: post-procedure vital signs reviewed and stable Respiratory status: spontaneous breathing, nonlabored ventilation and respiratory function stable Cardiovascular status: stable Postop Assessment: no headache, no backache and epidural receding Anesthetic complications: no   No notable events documented.  Last Vitals:  Vitals:   04/23/23 0054 04/23/23 0120  BP:  118/65  Pulse:  88  Resp: 18 18  Temp: 36.9 C 36.9 C  SpO2: 97% 98%    Last Pain:  Vitals:   04/23/23 0120  TempSrc: Oral  PainSc: 0-No pain   Pain Goal:                Epidural/Spinal Function Cutaneous sensation: Able to Wiggle Toes (04/23/23 0120), Patient able to flex knees: Yes (04/23/23 0120), Patient able to lift hips off bed: Yes (04/23/23 0120), Back pain beyond tenderness at insertion site: No (04/23/23 0120), Progressively worsening motor and/or sensory loss: No (04/23/23 0120), Bowel and/or bladder incontinence post epidural: No (04/23/23 0120)  [name]

## 2023-04-23 NOTE — Anesthesia Preprocedure Evaluation (Signed)
 Anesthesia Evaluation  Patient identified by MRN, date of birth, ID band Patient awake    Reviewed: Allergy & Precautions, H&P , NPO status , Patient's Chart, lab work & pertinent test results  Airway Mallampati: II  TM Distance: >3 FB Neck ROM: Full    Dental no notable dental hx.    Pulmonary neg pulmonary ROS, asthma    Pulmonary exam normal breath sounds clear to auscultation       Cardiovascular Exercise Tolerance: Good negative cardio ROS Normal cardiovascular exam Rhythm:Regular Rate:Normal     Neuro/Psych  PSYCHIATRIC DISORDERS Anxiety Depression    negative neurological ROS  negative psych ROS   GI/Hepatic negative GI ROS, Neg liver ROS,,,  Endo/Other  negative endocrine ROS    Renal/GU negative Renal ROS  negative genitourinary   Musculoskeletal negative musculoskeletal ROS (+)    Abdominal   Peds negative pediatric ROS (+)  Hematology negative hematology ROS (+)   Anesthesia Other Findings   Reproductive/Obstetrics negative OB ROS                             Anesthesia Physical Anesthesia Plan  ASA: 2  Anesthesia Plan: Epidural   Post-op Pain Management: Minimal or no pain anticipated   Induction:   PONV Risk Score and Plan:   Airway Management Planned:   Additional Equipment: Fetal Monitoring  Intra-op Plan:   Post-operative Plan:   Informed Consent: I have reviewed the patients History and Physical, chart, labs and discussed the procedure including the risks, benefits and alternatives for the proposed anesthesia with the patient or authorized representative who has indicated his/her understanding and acceptance.     Dental Advisory Given  Plan Discussed with: Anesthesiologist  Anesthesia Plan Comments: (Labs checked- platelets confirmed with RN in room. Fetal heart tracing, per RN, reported to be stable enough for sitting procedure. Discussed epidural,  and patient consents to the procedure:  included risk of possible headache,backache, failed block, allergic reaction, and nerve injury. This patient was asked if she had any questions or concerns before the procedure started.)       Anesthesia Quick Evaluation

## 2023-04-23 NOTE — Progress Notes (Signed)
 MOB was referred for history of depression/anxiety.  * Referral screened out by Clinical Social Worker because none of the following criteria appear to apply:  ~ History of anxiety/depression during this pregnancy, or of post-partum depression following prior delivery.  ~ Diagnosis of anxiety and/or depression within last 3 years  OR  * MOB's symptoms currently being treated with medication and/or therapy.  Per OB notes, MOB has a prescription for Zoloft 50mg  for support.  Please contact the Clinical Social Worker if needs arise, by [facility] request, or if MOB scores greater than 9/yes to question 10 on [place] Postpartum Depression Screen.  Enos Fling, Theresia Majors Clinical Social Worker [phone]

## 2023-04-23 NOTE — Lactation Note (Signed)
 This note was copied from a baby's chart. Lactation Consultation Note  Patient Name: Jaime Payne Today's Date: 04/23/2023 Age:26 hours  Encouraged mom in her hand expression again with last feeding. Encouraged mom - moving her colostrum out of her breast is the best stimulation for starting milk supply/ change of hormones after delivery. Encouraged mom hand expression is the best way to retrieve colostrum. Highlighted availability of LC - please call if assist desired. Additionally offered the set up of the DEBP - if mom desires- to stimulate breasts/keep milk supply on track     [facility] 04/23/2023, 1:38 PM

## 2023-04-23 NOTE — Lactation Note (Signed)
 This note was copied from a baby's chart. Lactation Consultation Note  Patient Name: Jaime Payne Today's Date: 04/23/2023 Age:26 hours Reason for consult: Initial assessment;Term  P2, 39 wks, @ 9 hrs of life. Discussed expectations @ breast- Day 1- sleepy/ feed every 3 hrs/ even 10 minutes is okay, Day 2 more awake/ feeding cues/longer feeds, and cluster feeding overnights brings milk in. Highlighted breast stimulation is tied directly to milk production- breasts respond more easily to lactation hormones with each baby- amazing she can hand express so well so early on. Per mom- no latch from baby- encouraged mom baby is just learning- highlighted steps of latching- Discussed hands on breast and baby, keeping baby awake @ breast. Starting with hand expression . LC services and milk storage shared. Encouraged mom to please call for assist anytime desired.  Maternal Data Has patient been taught Hand Expression?: Yes Does the patient have breastfeeding experience prior to this delivery?: Yes How long did the patient breastfeed?: 1 year  Feeding Mother's Current Feeding Choice: Breast Milk   Lactation Tools Discussed/Used Tools: Pump Breast pump type: Manual Pump Education: Milk Storage   Discharge Pump: Manual;Personal (Per mom has an electric breast pump @ home, recieved a hand pump from prior shift.)  Consult Status Consult Status: Follow-up Date: 04/24/23 Follow-up type: In-patient    [facility] 04/23/2023, 8:41 AM

## 2023-04-23 NOTE — Lactation Note (Signed)
 This note was copied from a baby's chart. Lactation Consultation Note  Patient Name: Jaime Payne ZOXWR'U Date: 04/23/2023 Reason for consult: Difficult latch;Mother's request  P2, 39 wks, @ 18 hrs of life. RN request. RN had spoon fed EBM, LC checked on couplet- infant in bassinet- showing feeding cues. Mom receptive to putting baby to breast. Demonstrated steps of latching. Infant responds well-  Cross-cradle on both breasts- 15 minutes overall.   Maternal Data Does the patient have breastfeeding experience prior to this delivery?: Yes How long did the patient breastfeed?: 1 yr  Feeding Mother's Current Feeding Choice: Breast Milk  LATCH Score Latch: Grasps breast easily, tongue down, lips flanged, rhythmical sucking.  Audible Swallowing: Spontaneous and intermittent  Type of Nipple: Everted at rest and after stimulation  Comfort (Breast/Nipple): Soft / non-tender  Hold (Positioning): Assistance needed to correctly position infant at breast and maintain latch.  LATCH Score: 9   Lactation Tools Discussed/Used    Interventions Interventions: Breast feeding basics reviewed;Assisted with latch;Hand express;Breast compression;Position options;Education  Discharge    Consult Status Consult Status: Follow-up Date: 04/24/23 Follow-up type: In-patient    [facility] 04/23/2023, 5:26 PM

## 2023-04-23 NOTE — Progress Notes (Signed)
 POSTPARTUM PROGRESS NOTE  Post Partum Day #1  Subjective:  No acute events overnight.  Pt denies problems with ambulating, voiding or po intake.  She denies nausea or vomiting.  Pain is well controlled.   Lochia Minimal. Feeling fatigued, working on latch with lactation, baby currently taking milk from spoon.   Objective: Blood pressure (!) 104/59, pulse 87, temperature 98.2 F (36.8 C), temperature source Oral, resp. rate 17, last menstrual period 07/04/2022, SpO2 99%, unknown if currently breastfeeding.  Physical Exam:  General: alert, cooperative and no distress Lochia:normal flow Chest: CTAB Heart: RRR no m/r/g Abdomen: +BS, soft, nontender Uterine Fundus: firm, 2cm below umbilicus GU: suture intact, healing well, no purulent drainage Extremities: neg edema, neg calf TTP BL, neg Homans BL  Recent Labs    04/22/23 1405 04/23/23 0515  HGB 10.7* 8.9*  HCT 32.2* 26.9*    Assessment/Plan:  ASSESSMENT: Azadeh MACARIA BIAS is a 28 y.o. [id number] s/p SVD @ [redacted]w[redacted]d. PNC c/b marginal cord insertion, depression on zoloft 5mg  at bedtime, h/o GHTN.   Plan for discharge tomorrow H/o GHTN - normotensive Anemia at 8.9 - asx, encourage PNV with iron at home  Anticipate DC tomorrow given late delivery last night   LOS: 1 day

## 2023-04-24 MED ORDER — IBUPROFEN 600 MG PO TABS: 600.0000 mg | ORAL_TABLET | Freq: Four times a day (QID) | ORAL | 1 refills | Status: AC | PRN

## 2023-04-24 NOTE — Lactation Note (Signed)
 This note was copied from a baby's chart. Lactation Consultation Note  Patient Name: Jaime Payne ZOXWR'U Date: 04/24/2023 Age:26 hours Reason for consult: Follow-up assessment;Maternal discharge  P2, 39 wks, @ 35 hrs of life. Per mom- both mom and baby got frustrated overnight with cluster feeding. Mom could no longer express breast milk, encouraged very normal for less flowing colostrum as milk begins to transition. Encouraged using hand expression to prime breast, and hand expression to keep baby working @ breast. Discussed differences of breast and bottle.  Offered mom assist @ breast if she desires- Mom chooses to bottle feed now.  Encouraged mom to keep working on big mouth latch- bottles/pacifiers allow baby to use little mouth latch- and use EBM or coconut oil after each feed. Discussed cluster feeding overnight/ early morning brings in our milk supply, shared expectations of milk coming in. Highlighted risk of engorgement. Discussed hand pump/express to soften breasts, motrin as anti-inflammatory, and ice packs for 10-20 minutes post feed/pumping if still over-full is the best treatments for inflamed/engorged breasts.    Feeding Mother's Current Feeding Choice: Breast Milk and Formula  Discharge Discharge Education: Engorgement and breast care Pump: Personal;Manual (Mom has electric and hand pump. Encouraged hand pump best tool to move milk from engorged breast)  Consult Status Consult Status: Complete Date: 04/24/23    Jaime Payne 04/24/2023, 10:11 AM

## 2023-04-24 NOTE — Discharge Summary (Signed)
 Postpartum Discharge Summary  Date of Service updated      Patient Name: Jaime Payne DOB: 1996-01-02 MRN: [id number]  Date of admission: 04/22/2023 Delivery date:04/22/2023 Delivering provider: Ambrose Mantle A Date of discharge: 04/24/2023  Admitting diagnosis: Pregnant and not yet delivered in third trimester [Z34.93] Intrauterine pregnancy: [redacted]w[redacted]d     Secondary diagnosis:  Principal Problem:   Pregnant and not yet delivered in third trimester  Additional problems: none    Discharge diagnosis: Term Pregnancy Delivered                                              Post partum procedures: n/a Augmentation: AROM and Pitocin Complications: None  Hospital course: Induction of Labor With Vaginal Delivery   28 y.o. yo [id number] at [redacted]w[redacted]d was admitted to the hospital 04/22/2023 for induction of labor.  Indication for induction: Favorable cervix at term.  Patient had an labor course complicated by none Membrane Rupture Time/Date: 8:09 PM,04/22/2023  Delivery Method:Vaginal, Spontaneous Operative Delivery:N/A Episiotomy: None Lacerations:  1st degree Details of delivery can be found in separate delivery note.  Patient had a postpartum course complicated by n/a. Patient is discharged home 04/24/23.  Newborn Data: Birth date:04/22/2023 Birth time:10:41 PM Gender:Female Living status:Living Apgars:9 ,9  Weight:3400 g  Magnesium Sulfate received: No BMZ received: No Rhophylac:N/A MMR:N/A T-DaP:Given prenatally Flu: Yes RSV Vaccine received: No Transfusion:No Immunizations administered: Immunization History  Administered Date(s) Administered   MMR 06/30/2020    Physical exam  Vitals:   04/23/23 0619 04/23/23 1500 04/23/23 2139 04/24/23 0539  BP: (!) 104/59 129/63 130/70 114/72  Pulse: 87 88 99 88  Resp: 17 16 16 18   Temp: 98.2 F (36.8 C) 98.1 F (36.7 C) 98.1 F (36.7 C) 98.1 F (36.7 C)  TempSrc: Oral Oral Oral Oral  SpO2: 99% 98% 96% 99%   General: alert,  cooperative, and no distress Lochia: appropriate Uterine Fundus: firm Incision: N/A DVT Evaluation: Calf/Ankle edema is present Labs: Lab Results  Component Value Date   WBC 13.0 (H) 04/23/2023   HGB 8.9 (L) 04/23/2023   HCT 26.9 (L) 04/23/2023   MCV 86.2 04/23/2023   PLT 211 04/23/2023      Latest Ref Rng & Units 06/28/2020    8:33 AM  CMP  Glucose 70 - 99 mg/dL 92   BUN 6 - 20 mg/dL 8   Creatinine 3.08 - 6.57 mg/dL 8.46   Sodium 962 - 952 mmol/L 134   Potassium 3.5 - 5.1 mmol/L 4.2   Chloride 98 - 111 mmol/L 107   CO2 22 - 32 mmol/L 18   Calcium 8.9 - 10.3 mg/dL 9.1   Total Protein 6.5 - 8.1 g/dL 6.0   Total Bilirubin 0.3 - 1.2 mg/dL 0.4   Alkaline Phos 38 - 126 U/L 217   AST 15 - 41 U/L 24   ALT 0 - 44 U/L 15    Edinburgh Score:    04/23/2023   11:06 PM  Edinburgh Postnatal Depression Scale Screening Tool  I have been able to laugh and see the funny side of things. --      After visit meds:  Allergies as of 04/24/2023   No Known Allergies      Medication List     TAKE these medications    ibuprofen 600 MG tablet Commonly known as: ADVIL Take  1 tablet (600 mg total) by mouth every 6 (six) hours as needed for moderate pain (pain score 4-6) or cramping.   prenatal multivitamin Tabs tablet Take 1 tablet by mouth daily at 12 noon.   sertraline 50 MG tablet Commonly known as: ZOLOFT Take 50 mg by mouth daily.         Discharge home in stable condition Infant Feeding: Breast Infant Disposition:home with mother Discharge instruction: per After Visit Summary and Postpartum booklet. Activity: Advance as tolerated. Pelvic rest for 6 weeks.  Diet: routine diet Anticipated Birth Control: Unsure Postpartum Appointment:6 weeks Additional Postpartum F/U: Postpartum Depression checkup Future Appointments:No future appointments. Follow up Visit:  Follow-up Information     Ob/Gyn, Nestor Ramp. Schedule an appointment as soon as possible for a visit in 6  week(s).   Why: For postpartum visit Contact information: [address] [place] [address] [phone]                     04/24/2023 Cathrine Muster, DO

## 2023-04-24 NOTE — Discharge Instructions (Signed)
 Call office with any concerns [phone]

## 2023-04-24 NOTE — Progress Notes (Signed)
 Post Partum Day 2 Subjective: no complaints, up ad lib, voiding, tolerating PO, + flatus, and lochia mild. She reports lower pelvic pressure - tolerable. She denies CP, SOB or dizziness. She is bonding well with baby. Feels ready for discharge to home today   Objective: Blood pressure 114/72, pulse 88, temperature 98.1 F (36.7 C), temperature source Oral, resp. rate 18, last menstrual period 07/04/2022, SpO2 99%, unknown if currently breastfeeding.  Physical Exam:  General: alert, cooperative, and no distress Lochia: appropriate Uterine Fundus: firm Incision: n/a DVT Evaluation: No evidence of DVT seen on physical exam. Calf/Ankle edema is present.  Recent Labs    04/22/23 1405 04/23/23 0515  HGB 10.7* 8.9*  HCT 32.2* 26.9*    Assessment/Plan: Discharge home and Breastfeeding 6 week pp visit advised  Rx sent    LOS: 2 days   Arti Trang W Orlando Thalmann, DO 04/24/2023, 9:38 AM

## 2023-05-01 ENCOUNTER — Inpatient Hospital Stay (HOSPITAL_COMMUNITY): Payer: Medicaid Other

## 2023-05-04 ENCOUNTER — Telehealth (HOSPITAL_COMMUNITY): Payer: Self-pay

## 2023-05-04 NOTE — Telephone Encounter (Signed)
 05/04/2023 1959  Name: Jaime Payne MRN: [id number] DOB: 20-Jan-1996  Reason for Call:  Transition of Care Hospital Discharge Call  Contact Status: Patient Contact Status: Complete  Language assistant needed:          Follow-Up Questions: Do You Have Any Concerns About Your Health As You Heal From Delivery?: Yes What Concerns Do You Have About Your Health?: Patient reports that she is sick. Patient states she has congestion, cough, and fever. She states that her OB prescribed an antibiotic but she did not pick it up because it was not covered by insurance. She states that she is having breast pain, breasts are hard and warm to touch. LC phone support line phone number provided to patient. Patient is not feeling well. RN told patient to contact her OB-GYN. RN also told patient about [facility] and that it 24 hours and we are happy to see her. Patient states she will reach out to her OB-GYN. She reports her mother is also sick in the household. She states that baby is doing well. Patient has no other questions or concerns. Do You Have Any Concerns About Your Infants Health?: No  Edinburgh Postnatal Depression Scale:  In the Past 7 Days:    PHQ2-9 Depression Scale:     Discharge Follow-up: Edinburgh score requires follow up?:  (Patient declines wanting to do EPDS. She states she had PPD with her 1st child and she is feeling really good emotionally with this baby. She states that she has been taking her zoloft as prescribed.) Patient was advised of the following resources:: Breastfeeding Support Group, Support Group Patient referred to:: [place], [place]  Post-discharge interventions: Reviewed Newborn Safe Sleep Practices  Signature  Signe Colt

## 2023-11-06 ENCOUNTER — Emergency Department (HOSPITAL_BASED_OUTPATIENT_CLINIC_OR_DEPARTMENT_OTHER)

## 2023-11-06 ENCOUNTER — Encounter (HOSPITAL_BASED_OUTPATIENT_CLINIC_OR_DEPARTMENT_OTHER): Payer: Self-pay | Admitting: Emergency Medicine

## 2023-11-06 ENCOUNTER — Emergency Department (HOSPITAL_BASED_OUTPATIENT_CLINIC_OR_DEPARTMENT_OTHER)
Admission: EM | Admit: 2023-11-06 | Discharge: 2023-11-07 | Disposition: A | Attending: Emergency Medicine | Admitting: Emergency Medicine

## 2023-11-06 DIAGNOSIS — R519 Headache, unspecified: Secondary | ICD-10-CM | POA: Diagnosis present

## 2023-11-06 DIAGNOSIS — Z5329 Procedure and treatment not carried out because of patient's decision for other reasons: Secondary | ICD-10-CM | POA: Diagnosis not present

## 2023-11-06 LAB — BASIC METABOLIC PANEL WITH GFR
Anion gap: 13 (ref 5–15)
BUN: 10 mg/dL (ref 6–20)
CO2: 21 mmol/L — ABNORMAL LOW (ref 22–32)
Calcium: 9.9 mg/dL (ref 8.9–10.3)
Chloride: 101 mmol/L (ref 98–111)
Creatinine, Ser: 0.61 mg/dL (ref 0.44–1.00)
GFR, Estimated: >60 mL/min (ref >60)
Glucose, Bld: 115 mg/dL — ABNORMAL HIGH (ref 70–99)
Potassium: 4.3 mmol/L (ref 3.5–5.1)
Sodium: 136 mmol/L (ref 135–145)

## 2023-11-06 LAB — CBC WITH DIFFERENTIAL/PLATELET
Abs Immature Granulocytes: 0.03 K/uL (ref 0.00–0.07)
Basophils Absolute: 0 K/uL (ref 0.0–0.1)
Basophils Relative: 0 %
Eosinophils Absolute: 0 K/uL (ref 0.0–0.5)
Eosinophils Relative: 0 %
HCT: 36.2 % (ref 36.0–46.0)
Hemoglobin: 12.1 g/dL (ref 12.0–15.0)
Immature Granulocytes: 0 %
Lymphocytes Relative: 12 %
Lymphs Abs: 1.4 K/uL (ref 0.7–4.0)
MCH: 28 pg (ref 26.0–34.0)
MCHC: 33.4 g/dL (ref 30.0–36.0)
MCV: 83.8 fL (ref 80.0–100.0)
Monocytes Absolute: 0.4 K/uL (ref 0.1–1.0)
Monocytes Relative: 3 %
Neutro Abs: 9.2 K/uL — ABNORMAL HIGH (ref 1.7–7.7)
Neutrophils Relative %: 85 %
Platelets: 290 K/uL (ref 150–400)
RBC: 4.32 MIL/uL (ref 3.87–5.11)
RDW: 13.1 % (ref 11.5–15.5)
WBC: 11.1 K/uL — ABNORMAL HIGH (ref 4.0–10.5)
nRBC: 0 % (ref 0.0–0.2)

## 2023-11-06 LAB — HCG, SERUM, QUALITATIVE: Preg, Serum: NEGATIVE

## 2023-11-06 LAB — SARS CORONAVIRUS 2 BY RT PCR: SARS Coronavirus 2 by RT PCR: NEGATIVE

## 2023-11-06 MED ORDER — SODIUM CHLORIDE 0.9 % IV BOLUS
500.0000 mL | Freq: Once | INTRAVENOUS | Status: AC
  Administered 2023-11-06: 500 mL via INTRAVENOUS

## 2023-11-06 MED ORDER — KETOROLAC TROMETHAMINE 15 MG/ML IJ SOLN
15.0000 mg | Freq: Once | INTRAMUSCULAR | Status: AC
  Administered 2023-11-06: 15 mg via INTRAVENOUS
  Filled 2023-11-06: qty 1

## 2023-11-06 MED ORDER — PROCHLORPERAZINE EDISYLATE 10 MG/2ML IJ SOLN
5.0000 mg | Freq: Once | INTRAMUSCULAR | Status: AC
  Administered 2023-11-06: 5 mg via INTRAVENOUS
  Filled 2023-11-06: qty 2

## 2023-11-06 MED ORDER — IOHEXOL 350 MG/ML SOLN
75.0000 mL | Freq: Once | INTRAVENOUS | Status: AC | PRN
  Administered 2023-11-06: 75 mL via INTRAVENOUS

## 2023-11-06 MED ORDER — PROCHLORPERAZINE EDISYLATE 10 MG/2ML IJ SOLN
5.0000 mg | Freq: Once | INTRAMUSCULAR | Status: DC
  Filled 2023-11-06: qty 2

## 2023-11-06 NOTE — ED Triage Notes (Signed)
 Migraine started yesterday around 6pm Never had a headache like this before Got worse throughout the day yesterday. + nausea, muscle aches. Chills Light sensitive Ibuprofen  last dose 6pm, no help

## 2023-11-06 NOTE — ED Provider Notes (Signed)
 Jaime Payne EMERGENCY DEPARTMENT AT [facility] Provider Note   CSN: [id number] Arrival date & time: 11/06/23  8146     Patient presents with: Migraine   Jaime Payne is a 28 y.o. female.    Migraine  Patient with headache.  Has had since around 6 PM yesterday.  Came on gradually but got more severe.  Behind the eyes.  Does have some photophobia.  No fevers but has had some chills.  No definite sick contacts.  Would occasionally get headaches but not headaches like this.  Had vaginal delivery 7 months ago.  Had URI type symptoms around 3 weeks ago.     Prior to Admission medications   Medication Sig Start Date End Date Taking? Authorizing Provider  ibuprofen  (ADVIL ) 600 MG tablet Take 1 tablet (600 mg total) by mouth every 6 (six) hours as needed for moderate pain (pain score 4-6) or cramping. 04/24/23   Banga, Jaime Morrison, DO  Prenatal Vit-Fe Fumarate-FA (PRENATAL MULTIVITAMIN) TABS tablet Take 1 tablet by mouth daily at 12 noon.    [provider]  sertraline  (ZOLOFT ) 50 MG tablet Take 50 mg by mouth daily.    [provider]    Allergies: Patient has no known allergies.    Review of Systems  Updated Vital Signs BP 108/71   Pulse (!) 105   Temp 99.2 F (37.3 C) (Oral)   Resp 20   SpO2 98%   Physical Exam Vitals and nursing note reviewed.  HENT:     Head: Atraumatic.  Eyes:     Pupils: Pupils are equal, round, and reactive to light.  Chest:     Chest wall: No tenderness.  Abdominal:     Tenderness: There is no abdominal tenderness.  Musculoskeletal:        General: No tenderness.     Cervical back: Neck supple. No tenderness.  Skin:    Capillary Refill: Capillary refill takes less than 2 seconds.  Neurological:     Mental Status: She is alert and oriented to person, place, and time.     (all labs ordered are listed, but only abnormal results are displayed) Labs Reviewed  BASIC METABOLIC PANEL WITH GFR - Abnormal; Notable  for the following components:      Result Value   CO2 21 (*)    Glucose, Bld 115 (*)    All other components within normal limits  CBC WITH DIFFERENTIAL/PLATELET - Abnormal; Notable for the following components:   WBC 11.1 (*)    Neutro Abs 9.2 (*)    All other components within normal limits  SARS CORONAVIRUS 2 BY RT PCR  HCG, SERUM, QUALITATIVE    EKG: None  Radiology: CT VENOGRAM HEAD Result Date: 11/06/2023 EXAM: CT VENOGRAM WITH CONTRAST 11/06/2023 09:49:27 PM TECHNIQUE: CT venogram of the head/brain was performed with the administration of 75 mL of iohexol  (OMNIPAQUE ) 350 MG/ML injection. Multiplanar reformatted images are provided for review. MIP images are provided for review. Automated exposure control, iterative reconstruction, and/or weight based adjustment of the mA/kV was utilized to reduce the radiation dose to as low as reasonably achievable. COMPARISON: None available. CLINICAL HISTORY: Dural venous sinus thrombosis suspected. Migraine started yesterday around 6pm; Never had a headache like this before; Got worse throughout the day yesterday; + nausea, muscle aches. Chills. FINDINGS: BRAIN/VENTRICLES: No acute intracranial hemorrhage. No extra axial fluid collection. Gray-white differentiation is maintained. No mass effect or midline shift. No hydrocephalus. ORBITS: No acute abnormality. SINUSES AND MASTOIDS: Moderate  mucosal thickening of the maxillary and sphenoid sinuses. No acute abnormality of the mastoids. SOFT TISSUES AND SKULL: No acute abnormality. CT VENOGRAM: Congenital variant demyelinated right transverse sinus. No dural venous sinus thrombosis. No significant stenosis. IMPRESSION: 1. No dural venous sinus thrombosis. 2. No acute intracranial abnormality. Electronically signed by: Jaime Stanford MD 11/06/2023 09:55 PM EDT RP Workstation: [id number]     Procedures   Medications Ordered in the ED  prochlorperazine (COMPAZINE) injection 5 mg (has no administration in  time range)  sodium chloride  0.9 % bolus 500 mL (500 mLs Intravenous New Bag/Given 11/06/23 2203)  ketorolac (TORADOL) 15 MG/ML injection 15 mg (15 mg Intravenous Given 11/06/23 2204)  prochlorperazine (COMPAZINE) injection 5 mg (5 mg Intravenous Given 11/06/23 2204)  iohexol  (OMNIPAQUE ) 350 MG/ML injection 75 mL (75 mLs Intravenous Contrast Given 11/06/23 2143)                                    Medical Decision Making Amount and/or Complexity of Data Reviewed Labs: ordered. Radiology: ordered.  Risk Prescription drug management.   Patient with headache.  Acute.  Is breast-feeding.  Went since yesterday.  Has had some chills at times.  No meningeal signs.  Will get head CT including venogram.  I think she is high risk for venous sinus thrombosis.  Will also get blood work.  Discussed with ED pharmacist, Jaime Payne.  Toradol and Compazine safe for breast-feeding.  White count mildly elevated at 11.  Head CT and CT venogram reassuring.  On recheck less pain at rest but states he did she still has pain when she moves her head.  Has had Toradol and Compazine but will redose Compazine at this time.  Meningitis considered but felt less likely but would like to get her head feeling better.  Care will be turned over to Dr. Carita     Final diagnoses:  Acute nonintractable headache, unspecified headache type    ED Discharge Orders     None          Jaime Lot, MD 11/06/23 2313

## 2023-11-07 LAB — RESP PANEL BY RT-PCR (RSV, FLU A&B, COVID)  RVPGX2
Influenza A by PCR: NEGATIVE
Influenza B by PCR: NEGATIVE
Resp Syncytial Virus by PCR: NEGATIVE
SARS Coronavirus 2 by RT PCR: NEGATIVE

## 2023-11-07 NOTE — Discharge Instructions (Signed)
 Your CT head is normal but you declined lumbar puncture today which is the only way to rule out meningitis.  Follow-up with your doctor return to the ED with new or worsening symptoms

## 2023-11-07 NOTE — ED Provider Notes (Signed)
 Care assumed from Dr. Patsey.  Gradual onset headache since 6 PM October 3.  CT head and CTV negative.  No fever.  Pending improvement in symptoms  At midnight, patient feels improved and is requesting discharge.  Recheck temperature is 98.7.  She has no meningismus.  Neurological exam is nonfocal.  CT head and CTV negative. WBC 11.   Low suspicion for bacterial meningitis but cannot rule out viral meningitis.  Discussed lumbar puncture with patient.  She declines.  She understands meningitis cannot be ruled out without this test and meningitis can be life-threatening.  She appears to have capacity to make this decision at this time.  Will treat supportively. She states she will follow-up with her doctor and return if worse.  She understands she is leaving as medical advice because meningitis has not been ruled out completely.  She appears to have capacity to make this decision   Carita Senior, MD 11/07/23 0025

## 2024-02-21 ENCOUNTER — Encounter (HOSPITAL_COMMUNITY): Payer: Self-pay

## 2024-02-21 ENCOUNTER — Ambulatory Visit (HOSPITAL_COMMUNITY)
Admission: RE | Admit: 2024-02-21 | Discharge: 2024-02-21 | Disposition: A | Discharge status: Home / Self Care | Payer: Self-pay

## 2024-02-21 VITALS — BP 109/71 | HR 80 | Temp 98.9°F | Resp 17

## 2024-02-21 DIAGNOSIS — R197 Diarrhea, unspecified: Secondary | ICD-10-CM

## 2024-02-21 DIAGNOSIS — H6690 Otitis media, unspecified, unspecified ear: Secondary | ICD-10-CM

## 2024-02-21 LAB — POCT INFLUENZA A/B
Influenza A, POC: NEGATIVE
Influenza B, POC: NEGATIVE

## 2024-02-21 LAB — POC SOFIA SARS ANTIGEN FIA: SARS Coronavirus 2 Ag: NEGATIVE

## 2024-02-21 MED ORDER — ONDANSETRON 4 MG PO TBDP: 4.0000 mg | ORAL_TABLET | Freq: Three times a day (TID) | ORAL | 0 refills | Status: AC | PRN

## 2024-02-21 MED ORDER — AMOXICILLIN-POT CLAVULANATE 875-125 MG PO TABS: 1.0000 | ORAL_TABLET | Freq: Two times a day (BID) | ORAL | 0 refills | Status: AC

## 2024-02-21 NOTE — Discharge Instructions (Addendum)
 You have an ear infection in your left ear.  Take Augmentin  twice a day for 7 days.  Take Tylenol  and/or ibuprofen  as needed for fever, pain.  Diarrhea and nausea is likely virus related.  Take ondansetron  every 8 hours as needed for nausea, vomiting. You may take Imodium (available over-the-counter) if diarrhea persists.  Focus on staying hydrated. Have foods that are easy to digest such as bread, crackers, rice, applesauce, bananas. Advance diet as tolerated.  If you develop new or worsening symptoms or if your symptoms do not start to improve, return here or follow-up with your primary care provider.  If your symptoms are severe, please go to the emergency room.

## 2024-02-21 NOTE — ED Triage Notes (Signed)
 Pt has c/o left ear pain and fullness x 2 days. Pt states she has been using ear drops and ibuprofen  at home with little relief. Last dose of ibuprofen  was 7 am this morning.  Pt also states she started having diarrhea and nausea last night.

## 2024-02-21 NOTE — ED Provider Notes (Signed)
 " [facility]    CSN: [id number] Arrival date & time: 02/21/24  1625      History   Chief Complaint Chief Complaint  Patient presents with   Ear Fullness    Ear pain for 2 days. Pain has increased to my left jaw. Hearing loss - Entered by patient    HPI Jaime Payne is a 29 y.o. female.   This 29 year old female is being seen for complaints of left ear pain since Saturday.  She has used ibuprofen  at home with little relief of symptoms.  Last dose of ibuprofen  was 7 AM.  She also complains of nausea, diarrhea onset last night.  She reports approximately 6-8 episodes of diarrhea today.  She denies bloody or black tarry stool.  She denies headache, dizziness, nasal congestion, sore throat.  She denies chest pain, shortness of breath, cough.  She denies abdominal pain, vomiting.  She is found with tissue paper in her left ear.  She says that she has been putting Vicks VapoRub on the tissue and sticking it in her ear.   Ear Fullness Pertinent negatives include no chest pain, no abdominal pain, no headaches and no shortness of breath.    Past Medical History:  Diagnosis Date   Anxiety    Asthma    Depression    Leukemia (HCC)    [age]    Patient Active Problem List   Diagnosis Date Noted   Pregnant and not yet delivered in third trimester 04/22/2023   [redacted] weeks gestation of pregnancy 06/28/2020    Past Surgical History:  Procedure Laterality Date   PORTACATH PLACEMENT     RHINOPLASTY  2019   WISDOM TOOTH EXTRACTION     x 4    OB History     Gravida  3   Para  2   Term  2   Preterm      AB  1   Living  2      SAB      IAB  1   Ectopic      Multiple  0   Live Births  2            Home Medications    Prior to Admission medications  Medication Sig Start Date End Date Taking? Authorizing Provider  amoxicillin -clavulanate (AUGMENTIN ) 875-125 MG tablet Take 1 tablet by mouth every 12 (twelve) hours for 7 days. 02/21/24 02/28/24 Yes  Hulbert Branscome C, FNP  buPROPion (WELLBUTRIN XL) 150 MG 24 hr tablet Take 150 mg by mouth daily. 01/28/24  Yes [provider]  ondansetron  (ZOFRAN -ODT) 4 MG disintegrating tablet Take 1 tablet (4 mg total) by mouth every 8 (eight) hours as needed for nausea or vomiting. 02/21/24  Yes Tome Wilson C, FNP  propranolol (INDERAL) 20 MG tablet Take 20 mg by mouth 2 (two) times daily as needed. 02/04/24  Yes [provider]  ibuprofen  (ADVIL ) 600 MG tablet Take 1 tablet (600 mg total) by mouth every 6 (six) hours as needed for moderate pain (pain score 4-6) or cramping. 04/24/23   Banga, Ted Morrison, DO    Family History Family History  Problem Relation Age of Onset   Anxiety disorder Mother    Diabetes Mother    Hypertension Mother    Diabetes Father    Hypertension Father    Hyperlipidemia Father    Birth defects Maternal Grandfather    Birth defects Paternal Grandfather        liver  Social History Social History[1]   Allergies   Patient has no known allergies.   Review of Systems Review of Systems  Constitutional:  Negative for activity change, appetite change, chills and fever.  HENT:  Positive for ear pain and hearing loss. Negative for congestion, ear discharge and sore throat.   Respiratory:  Negative for cough and shortness of breath.   Cardiovascular:  Negative for chest pain.  Gastrointestinal:  Positive for diarrhea and nausea. Negative for abdominal pain and vomiting.  Skin:  Negative for color change and rash.  Neurological:  Negative for dizziness and headaches.  All other systems reviewed and are negative.    Physical Exam Triage Vital Signs ED Triage Vitals  Encounter Vitals Group     BP 02/21/24 1737 109/71     Girls Systolic BP Percentile --      Girls Diastolic BP Percentile --      Boys Systolic BP Percentile --      Boys Diastolic BP Percentile --      Pulse Rate 02/21/24 1737 80     Resp 02/21/24 1737 17     Temp 02/21/24 1737  98.9 F (37.2 C)     Temp Source 02/21/24 1737 Oral     SpO2 02/21/24 1737 95 %     Weight --      Height --      Head Circumference --      Peak Flow --      Pain Score 02/21/24 1733 4     Pain Loc --      Pain Education --      Exclude from Growth Chart --    No data found.  Updated Vital Signs BP 109/71 (BP Location: Right Arm)   Pulse 80   Temp 98.9 F (37.2 C) (Oral)   Resp 17   SpO2 95%   Visual Acuity Right Eye Distance:   Left Eye Distance:   Bilateral Distance:    Right Eye Near:   Left Eye Near:    Bilateral Near:     Physical Exam Vitals and nursing note reviewed.  Constitutional:      General: She is not in acute distress.    Appearance: She is well-developed. She is not toxic-appearing.     Comments: Pleasant female appearing stated age found sitting in chair in no acute distress.  HENT:     Head: Normocephalic and atraumatic.     Right Ear: Tympanic membrane and external ear normal.     Left Ear: External ear normal. Tympanic membrane is erythematous and bulging.     Nose: Nose normal.     Mouth/Throat:     Lips: Pink.     Mouth: Mucous membranes are moist.     Pharynx: No oropharyngeal exudate or posterior oropharyngeal erythema.  Eyes:     Conjunctiva/sclera: Conjunctivae normal.  Cardiovascular:     Rate and Rhythm: Normal rate and regular rhythm.     Heart sounds: Normal heart sounds. No murmur heard. Pulmonary:     Effort: Pulmonary effort is normal. No respiratory distress.     Breath sounds: Normal breath sounds.  Abdominal:     General: Bowel sounds are normal.     Palpations: Abdomen is soft.     Tenderness: There is no abdominal tenderness.  Musculoskeletal:     Cervical back: Neck supple.  Skin:    General: Skin is warm and dry.     Capillary Refill: Capillary refill takes less than  2 seconds.  Neurological:     Mental Status: She is alert.  Psychiatric:        Mood and Affect: Mood normal.      UC Treatments / Results   Labs (all labs ordered are listed, but only abnormal results are displayed) Labs Reviewed  POCT INFLUENZA A/B  POC SOFIA SARS ANTIGEN FIA    EKG   Radiology No results found.  Procedures Procedures (including critical care time)  Medications Ordered in UC Medications - No data to display  Initial Impression / Assessment and Plan / UC Course  I have reviewed the triage vital signs and the nursing notes.  Pertinent labs & imaging results that were available during my care of the patient were reviewed by me and considered in my medical decision making (see chart for details).     Vitals and triage reviewed, patient is hemodynamically stable.  COVID and flu swabs obtained and are negative.  Presentation consistent with acute otitis media of the left ear and likely viral gastrointestinal illness.  She is given prescription for Augmentin , ondansetron .  She is advised Tylenol  and/or ibuprofen  as needed.  Advised to stay hydrated, eat foods that are easy to digest, advance diet as tolerated.  Plan of care, follow-up care, return precautions given, no questions at this time. Final Clinical Impressions(s) / UC Diagnoses   Final diagnoses:  Diarrhea, unspecified type  Acute otitis media, unspecified otitis media type     Discharge Instructions      You have an ear infection in your left ear.  Take Augmentin  twice a day for 7 days.  Take Tylenol  and/or ibuprofen  as needed for fever, pain.  Diarrhea and nausea is likely virus related.  Take ondansetron  every 8 hours as needed for nausea, vomiting. You may take Imodium (available over-the-counter) if diarrhea persists.  Focus on staying hydrated. Have foods that are easy to digest such as bread, crackers, rice, applesauce, bananas. Advance diet as tolerated.  If you develop new or worsening symptoms or if your symptoms do not start to improve, return here or follow-up with your primary care provider.  If your symptoms are  severe, please go to the emergency room.      ED Prescriptions     Medication Sig Dispense Auth. Provider   amoxicillin -clavulanate (AUGMENTIN ) 875-125 MG tablet Take 1 tablet by mouth every 12 (twelve) hours for 7 days. 14 tablet Mekenzie Modeste C, FNP   ondansetron  (ZOFRAN -ODT) 4 MG disintegrating tablet Take 1 tablet (4 mg total) by mouth every 8 (eight) hours as needed for nausea or vomiting. 12 tablet Saamiya Jeppsen C, FNP      PDMP not reviewed this encounter.     [1]  Social History Tobacco Use   Smoking status: Never   Smokeless tobacco: Never  Vaping Use   Vaping status: Never Used  Substance Use Topics   Alcohol use: Never   Drug use: Never     Lennice Jon BROCKS, FNP 02/21/24 1934  "

## 2024-02-24 ENCOUNTER — Other Ambulatory Visit (HOSPITAL_COMMUNITY): Payer: Self-pay

## 2024-02-25 ENCOUNTER — Other Ambulatory Visit: Payer: Self-pay

## 2024-02-25 MED ORDER — ONDANSETRON 4 MG PO TBDP
4.0000 mg | ORAL_TABLET | Freq: Three times a day (TID) | ORAL | 0 refills | Status: AC | PRN
  Filled 2024-02-25: qty 12, 4d supply, fill #0

## 2024-02-25 MED ORDER — AMOXICILLIN-POT CLAVULANATE 875-125 MG PO TABS
1.0000 | ORAL_TABLET | Freq: Two times a day (BID) | ORAL | 0 refills | Status: AC
  Filled 2024-02-25: qty 14, 7d supply, fill #0

## 2024-03-09 ENCOUNTER — Other Ambulatory Visit (HOSPITAL_COMMUNITY): Payer: Self-pay

## 2024-03-10 ENCOUNTER — Other Ambulatory Visit (HOSPITAL_COMMUNITY): Payer: Self-pay

## 2024-03-10 MED ORDER — PHENTERMINE HCL 30 MG PO CAPS
30.0000 mg | ORAL_CAPSULE | Freq: Every day | ORAL | 2 refills | Status: AC
  Filled 2024-03-10: qty 30, 30d supply, fill #0

## 2024-03-10 MED ORDER — BUPROPION HCL ER (XL) 300 MG PO TB24
300.0000 mg | ORAL_TABLET | Freq: Every day | ORAL | 3 refills | Status: AC
  Filled 2024-03-10: qty 90, 90d supply, fill #0

## 2024-03-10 MED ORDER — PROPRANOLOL HCL 20 MG PO TABS
20.0000 mg | ORAL_TABLET | Freq: Two times a day (BID) | ORAL | 2 refills | Status: AC | PRN
  Filled 2024-03-10: qty 60, 30d supply, fill #0
